# Patient Record
Sex: Male | Born: 1960
Health system: Southern US, Community
[De-identification: ages and names within clinical notes are randomized; demographics above are authoritative.]

## PROBLEM LIST (undated history)

## (undated) DIAGNOSIS — U071 COVID-19: Secondary | ICD-10-CM

## (undated) DIAGNOSIS — Z6830 Body mass index (BMI) 30.0-30.9, adult: Secondary | ICD-10-CM

## (undated) DIAGNOSIS — N200 Calculus of kidney: Secondary | ICD-10-CM

## (undated) HISTORY — DX: Body mass index (BMI) 30.0-30.9, adult: Z68.30

## (undated) HISTORY — DX: COVID-19: U07.1

---

## 1998-07-30 ENCOUNTER — Emergency Department (HOSPITAL_COMMUNITY): Admission: EM | Admit: 1998-07-30 | Discharge: 1998-07-30 | Payer: Self-pay | Admitting: Emergency Medicine

## 1999-09-03 HISTORY — PX: OTHER SURGICAL HISTORY: SHX169

## 2000-04-30 ENCOUNTER — Inpatient Hospital Stay (HOSPITAL_COMMUNITY): Admission: EM | Admit: 2000-04-30 | Discharge: 2000-05-02 | Payer: Self-pay | Admitting: Emergency Medicine

## 2000-04-30 ENCOUNTER — Encounter (INDEPENDENT_AMBULATORY_CARE_PROVIDER_SITE_OTHER): Payer: Self-pay | Admitting: Specialist

## 2002-08-24 ENCOUNTER — Emergency Department (HOSPITAL_COMMUNITY): Admission: EM | Admit: 2002-08-24 | Discharge: 2002-08-24 | Payer: Self-pay | Admitting: Emergency Medicine

## 2003-11-26 ENCOUNTER — Emergency Department (HOSPITAL_COMMUNITY): Admission: EM | Admit: 2003-11-26 | Discharge: 2003-11-26 | Payer: Self-pay | Admitting: Emergency Medicine

## 2003-12-08 ENCOUNTER — Emergency Department (HOSPITAL_COMMUNITY): Admission: EM | Admit: 2003-12-08 | Discharge: 2003-12-09 | Payer: Self-pay | Admitting: Emergency Medicine

## 2004-10-26 ENCOUNTER — Emergency Department (HOSPITAL_COMMUNITY): Admission: EM | Admit: 2004-10-26 | Discharge: 2004-10-26 | Payer: Self-pay | Admitting: Emergency Medicine

## 2005-12-14 ENCOUNTER — Emergency Department (HOSPITAL_COMMUNITY): Admission: EM | Admit: 2005-12-14 | Discharge: 2005-12-14 | Payer: Self-pay | Admitting: Emergency Medicine

## 2005-12-15 ENCOUNTER — Emergency Department (HOSPITAL_COMMUNITY): Admission: EM | Admit: 2005-12-15 | Discharge: 2005-12-15 | Payer: Self-pay | Admitting: Emergency Medicine

## 2006-11-21 ENCOUNTER — Emergency Department (HOSPITAL_COMMUNITY): Admission: EM | Admit: 2006-11-21 | Discharge: 2006-11-21 | Payer: Self-pay | Admitting: Emergency Medicine

## 2008-05-05 ENCOUNTER — Observation Stay (HOSPITAL_COMMUNITY): Admission: EM | Admit: 2008-05-05 | Discharge: 2008-05-06 | Payer: Self-pay | Admitting: Emergency Medicine

## 2008-05-05 ENCOUNTER — Ambulatory Visit: Payer: Self-pay | Admitting: Family Medicine

## 2008-05-23 ENCOUNTER — Encounter (INDEPENDENT_AMBULATORY_CARE_PROVIDER_SITE_OTHER): Payer: Self-pay | Admitting: *Deleted

## 2008-08-03 ENCOUNTER — Emergency Department (HOSPITAL_COMMUNITY): Admission: EM | Admit: 2008-08-03 | Discharge: 2008-08-03 | Payer: Self-pay | Admitting: Emergency Medicine

## 2009-03-12 ENCOUNTER — Emergency Department (HOSPITAL_COMMUNITY): Admission: EM | Admit: 2009-03-12 | Discharge: 2009-03-12 | Payer: Self-pay | Admitting: Emergency Medicine

## 2009-10-17 ENCOUNTER — Emergency Department (HOSPITAL_COMMUNITY): Admission: EM | Admit: 2009-10-17 | Discharge: 2009-10-18 | Payer: Self-pay | Admitting: Emergency Medicine

## 2009-11-03 ENCOUNTER — Emergency Department (HOSPITAL_COMMUNITY): Admission: EM | Admit: 2009-11-03 | Discharge: 2009-11-03 | Payer: Self-pay | Admitting: Internal Medicine

## 2009-11-29 ENCOUNTER — Emergency Department (HOSPITAL_COMMUNITY): Admission: EM | Admit: 2009-11-29 | Discharge: 2009-11-29 | Payer: Self-pay | Admitting: Emergency Medicine

## 2010-11-23 LAB — HEPATIC FUNCTION PANEL
ALT: 27 U/L (ref 0–53)
Alkaline Phosphatase: 87 U/L (ref 39–117)
Total Protein: 7.7 g/dL (ref 6.0–8.3)

## 2010-11-23 LAB — CBC
HCT: 41.3 % (ref 39.0–52.0)
Hemoglobin: 13.6 g/dL (ref 13.0–17.0)
MCV: 91.7 fL (ref 78.0–100.0)
RBC: 4.5 MIL/uL (ref 4.22–5.81)
WBC: 6.6 10*3/uL (ref 4.0–10.5)

## 2010-11-23 LAB — DIFFERENTIAL
Basophils Absolute: 0 10*3/uL (ref 0.0–0.1)
Basophils Relative: 0 % (ref 0–1)
Eosinophils Absolute: 0 10*3/uL (ref 0.0–0.7)
Eosinophils Relative: 1 % (ref 0–5)
Monocytes Absolute: 0.8 10*3/uL (ref 0.1–1.0)
Monocytes Relative: 13 % — ABNORMAL HIGH (ref 3–12)
Neutro Abs: 4.8 10*3/uL (ref 1.7–7.7)

## 2010-11-23 LAB — LIPASE, BLOOD: Lipase: 29 U/L (ref 11–59)

## 2010-11-23 LAB — BASIC METABOLIC PANEL
Calcium: 8.7 mg/dL (ref 8.4–10.5)
GFR calc Af Amer: >60 mL/min (ref >60)
GFR calc non Af Amer: >60 mL/min (ref >60)
Glucose, Bld: 96 mg/dL (ref 70–99)

## 2010-12-09 LAB — WOUND CULTURE: Gram Stain: NONE SEEN

## 2011-01-15 NOTE — Discharge Summary (Signed)
[name], [name]                 ACCOUNT NO.:  [id number]   MEDICAL RECORD NO.:  [id number]          PATIENT TYPE:  OBV   LOCATION:  4732                         FACILITY:  [facility]   PHYSICIAN:  Paula Compton, MD        DATE OF BIRTH:  06-26-1961   DATE OF ADMISSION:  05/05/2008  DATE OF DISCHARGE:  05/06/2008                               DISCHARGE SUMMARY   PRIMARY CARE Deetya Drouillard:  Gentry Fitz.   DISCHARGE DIAGNOSIS:  Noncardiac chest pain.   DISCHARGE MEDICATIONS:  1. Aspirin 81 mg 1 tab by mouth daily.  2. Toprol-XL 25 mg 1 tab by mouth twice a day.  3. Motrin 600 mg 1 tab by mouth twice a day as needed for pain.   PROCEDURES:  1. EKG on admission; Q-waves in anterior leads, right bundle-branch      block, sinus bradycardia.  2. Repeat EKG, unchanged.   LABS:  1. Cardiac enzymes negative x3.  2. Fasting lipid panel, total cholesterol 179, LDL 130, HDL 38,      triglycerides 57.   BRIEF HOSPITAL COURSE:  The patient is a 50 year old male who presented  to [facility] Emergency Department with once longstanding atypical chest  pain.  1. Atypical chest pain.  The patient complained two different types of      chest pain both seeming inconsistent with cardiac chest pain.      First chest pain has been going on for 15 years.  It is described      in the left chest wall, near axilla, associated with some left arm      numbness, comes and goes about every 4 months.  Other type of chest      pain.  It is substernal in location.  It is associated with      movement and in twisting certain directions.  Pain comes on all of      a sudden, lasts for 15-30 minutes, and then resolved on its own.      The patient was evaluated and ruled out for an MI.  EKG did have      some findings including Q-waves in anterior leads and a right      bundle-branch block.  A repeat EKG was unchanged.  On the day of      discharge, the patient was not complaining of any chest pain,      shortness of  breath, pleuritic chest pain.  The patient felt good      and the family practice service team felt that the patient was      stable and appropriate for outpatient followup.  The patient will      be followed up with [facility] and will be      scheduled an exercise stress test for further characterization of      his chest pain.  The patient was in complete understanding and      agreement.   DISCHARGE INSTRUCTIONS:  The patient has no restrictions on his diet.  No restrictions on activity.  The patient was  encouraged to get more  sleep and to avoid lifting heavy objects when he is in pain.   FOLLOWUP APPOINTMENTS:  The patient is to return to Dr. Lelon Perla at  [facility], phone number [phone] on May 24, 2008  at 1:30 p.m.  The patient is also to be scheduled an exercise stress  test as an outpatient.   DISCHARGE CONDITION:  The patient was discharged home in stable medical  condition.      Angelena Sole, MD  Electronically Signed      Paula Compton, MD  Electronically Signed    WS/MEDQ  D:  05/06/2008  T:  05/06/2008  Job:  [id number]

## 2011-01-18 NOTE — Op Note (Signed)
[place]. [facility]  Patient:    [name], [name]                          MRN: [id number] Proc. Date: 04/30/00 Adm. Date:  [id number] Attending:  Glenna Fellows Tappan                           Operative Report  PREOPERATIVE DIAGNOSIS:  Acute appendicitis.  POSTOPERATIVE DIAGNOSIS:  Acute appendicitis.  SURGICAL PROCEDURE:  Laparoscopy and open appendectomy.  SURGEON:  Lorne Skeens. Hoxworth, M.D.  ANESTHESIA:  General.  BRIEF HISTORY:  [name] is a 50 year old black male, who presents with an approximately 36-hour history of typical symptoms of appendicitis with initially periumbilical then steady and worsening right lower quadrant abdominal pain.  On examination, he has well-localized right lower quadrant abdominal pain with local peritoneal signs and white count is elevated at 14000.  Laparoscopic possible open appendectomy had been  recommended and accepted.  The nature of the procedure, its indications, and risks of bleeding and infection were discussed and understood.  DESCRIPTION OF PROCEDURE:  Patient brought to the operating room, placed in the supine position on the operating table and general endotracheal anesthesia was induced.  Foley catheter was placed.  The broad-spectrum antibiotics were given intravenously.  The abdomen was sterilely prepped and draped.  PS were in place.  A 1 cm incision was made at the umbilicus.  Dissection carried down to the midline fascia, which was sharply incised for 1 cm and the Hasson trocar inserted under direct vision.  A pneumoperitoneum was established.  The laparoscopy revealed a markedly inflamed appendix, which was adherent to the anterior abdominal wall.  Under direct vision, a 5 mm trocar was placed in the right upper quadrant and a 12 mm trocar in the left lower quadrant.  The inflammatory attachments were bluntly dissected off of the anterior abdominal wall.  The appendix was markedly thickened and  appeared to be chronically, as well as, acutely inflamed.  It was quite adherent to the terminal ileum, as well as, to the wall of the cecum.  After some initial dissection, I determined that I really could not safely free up the appendix laparoscopically and decided to convert the procedure to open.  The trocars were removed.  The purse-string suture was secured.  A right lower quadrant transverse incision was made and the external oblique divided in long lines of its fibers.  The internal oblique was bluntly split and the peritoneum, which was quite thickened.  This area was sharply entered.  The lateral edge of the rectus sheath was also opened.  A small amount of the rectus muscle divided extending the incision medially.  The inflammatory mass and cecum were brought out through the incision.  There were what appeared to be some chronic adhesions of the ______ of the terminal ileum to the inflamed appendix, which were divided between clamps and tied with 3-0 silk ties.  Some further adhesions of the appendix, which were markedly chronically and thickened, as well as, acutely inflamed were taken down along the cecum and the appendix was freed down to the mesoappendix, which was divided between clamps and tied with 3-0 silk ties.  The appendix was freed down to its base.  The appendix was quite thickened just beyond the base, but right at the junction of the cecum. The tissue felt normal.  This normal area was divided  with a firing of the TA-30 stapler and the appendix was removed.  The appendix did contain mucin. I opened the appendix and there was no obvious tumor mass, but it did contain mucin and was quite thickened.  I elected at this point to send this for permanent section and closed.  The right lower quadrant was thoroughly irrigated.  ______ was inspected for hemostasis and the viscera returned to their anatomic position.  The fascia was closed in layers with running 0 PDS. The  subcutaneous tissues were irrigated.  The incisions were closed with staples.  Sponge and needle counts were correct.  Dry, sterile dressings were applied and the patient taken to recovery in good condition. DD:  04/30/00 TD:  05/01/00 Job: [id number]

## 2011-06-05 LAB — TSH: TSH: 1.178

## 2011-06-05 LAB — LIPID PANEL
LDL Cholesterol: 130 — ABNORMAL HIGH
Triglycerides: 57

## 2011-06-05 LAB — CBC
HCT: 37.6 — ABNORMAL LOW
Hemoglobin: 12.5 — ABNORMAL LOW
MCHC: 33.3
Platelets: 238
RBC: 4.13 — ABNORMAL LOW
RDW: 12.3
WBC: 7.6

## 2011-06-05 LAB — POCT I-STAT, CHEM 8
Calcium, Ion: 1.15
Chloride: 107
Potassium: 3.9
Sodium: 138
TCO2: 25

## 2011-06-05 LAB — POCT CARDIAC MARKERS
CKMB, poc: 1.3
CKMB, poc: 1.4
Myoglobin, poc: 66.7
Troponin i, poc: <0.05

## 2011-06-05 LAB — BASIC METABOLIC PANEL
BUN: 15
Creatinine, Ser: 0.94
GFR calc Af Amer: >60
GFR calc non Af Amer: >60
Glucose, Bld: 94

## 2011-06-05 LAB — CK TOTAL AND CKMB (NOT AT ARMC): CK, MB: 1.7

## 2011-06-05 LAB — CARDIAC PANEL(CRET KIN+CKTOT+MB+TROPI): CK, MB: 1.4

## 2011-06-05 LAB — TROPONIN I: Troponin I: 0.01

## 2013-09-12 ENCOUNTER — Emergency Department (HOSPITAL_BASED_OUTPATIENT_CLINIC_OR_DEPARTMENT_OTHER)
Admission: EM | Admit: 2013-09-12 | Discharge: 2013-09-12 | Disposition: A | Discharge status: Home / Self Care | Payer: Self-pay | Attending: Emergency Medicine | Admitting: Emergency Medicine

## 2013-09-12 ENCOUNTER — Encounter (HOSPITAL_BASED_OUTPATIENT_CLINIC_OR_DEPARTMENT_OTHER): Payer: Self-pay | Admitting: Emergency Medicine

## 2013-09-12 DIAGNOSIS — K047 Periapical abscess without sinus: Secondary | ICD-10-CM | POA: Insufficient documentation

## 2013-09-12 DIAGNOSIS — K029 Dental caries, unspecified: Secondary | ICD-10-CM | POA: Insufficient documentation

## 2013-09-12 MED ORDER — IBUPROFEN 600 MG PO TABS: 600.0000 mg | ORAL_TABLET | Freq: Four times a day (QID) | ORAL | Status: DC | PRN

## 2013-09-12 MED ORDER — OXYCODONE-ACETAMINOPHEN 5-325 MG PO TABS: 1.0000 | ORAL_TABLET | ORAL | Status: DC | PRN

## 2013-09-12 MED ORDER — AMOXICILLIN 500 MG PO CAPS
500.0000 mg | ORAL_CAPSULE | Freq: Three times a day (TID) | ORAL | Status: DC
Start: 2013-09-12 — End: 2016-10-29

## 2013-09-12 NOTE — ED Notes (Signed)
Patient here with right lower dental pain x 2 days, reports tooth broken and just started swelling and causing pain

## 2013-09-12 NOTE — Discharge Instructions (Signed)
It is important that you get care with a dentist as soon as possible. Return here as needed.  Abscessed Tooth An abscessed tooth is an infection around your tooth. It may be caused by holes or damage to the tooth (cavity) or a dental disease. An abscessed tooth causes mild to very bad pain in and around the tooth. See your dentist right away if you have tooth or gum pain. HOME CARE  Take your medicine as told. Finish it even if you start to feel better.  Do not drive after taking pain medicine.  Rinse your mouth (gargle) often with salt water ( teaspoon salt in 8 ounces of warm water).  Do not apply heat to the outside of your face. GET HELP RIGHT AWAY IF:   You have a temperature by mouth above 102 F (38.9 C), not controlled by medicine.  You have chills and a very bad headache.  You have problems breathing or swallowing.  Your mouth will not open.  You develop puffiness (swelling) on the neck or around the eye.  Your pain is not helped by medicine.  Your pain is getting worse instead of better. MAKE SURE YOU:   Understand these instructions.  Will watch your condition.  Will get help right away if you are not doing well or get worse. Document Released: 02/05/2008 Document Revised: 11/11/2011 Document Reviewed: 11/27/2010 [facility] Patient Information 2014 [employer], [place].

## 2013-09-12 NOTE — ED Provider Notes (Signed)
CSN: [id number]     Arrival date & time 09/12/13  1232 History   First MD Initiated Contact with Patient 09/12/13 1255     Chief Complaint  Patient presents with  . Dental Pain   (Consider location/radiation/quality/duration/timing/severity/associated sxs/prior Treatment) Patient is a 53 y.o. male presenting with tooth pain. The history is provided by the patient.  Dental Pain Location:  Lower Lower teeth location:  30/RL 1st molar, 29/RL 2nd bicuspid and 31/RL 2nd molar Quality:  Throbbing Severity:  Severe Onset quality:  Gradual Duration:  2 days Timing:  Constant Progression:  Worsening Chronicity:  New Context: abscess and poor dentition   Relieved by:  Nothing Worsened by:  Cold food/drink, touching, pressure and jaw movement Ineffective treatments:  None tried Associated symptoms: facial swelling   Associated symptoms: no fever and no oral lesions     History reviewed. No pertinent past medical history. History reviewed. No pertinent past surgical history. No family history on file. History  Substance Use Topics  . Smoking status: Never Smoker   . Smokeless tobacco: Not on file  . Alcohol Use: Not on file    Review of Systems  Constitutional: Negative for fever and chills.  HENT: Positive for dental problem and facial swelling. Negative for ear pain, mouth sores and rhinorrhea.   Eyes: Negative for pain and redness.  Respiratory: Negative for cough and shortness of breath.   Cardiovascular: Negative for chest pain.  Gastrointestinal: Negative for nausea, vomiting and abdominal pain.  Musculoskeletal: Negative for myalgias.  Skin: Negative for rash.  Neurological: Negative for dizziness and light-headedness.  Psychiatric/Behavioral: Negative for confusion. The patient is not nervous/anxious.     Allergies  Review of patient's allergies indicates no known allergies.  Home Medications  No current outpatient prescriptions on file. BP 140/104  Pulse 82   Temp(Src) 99.1 F (37.3 C) (Oral)  Resp 18  SpO2 98% Physical Exam  Nursing note and vitals reviewed. Constitutional: He is oriented to person, place, and time. He appears well-developed and well-nourished.  HENT:  Head:    Mouth/Throat: Uvula is midline, oropharynx is clear and moist and mucous membranes are normal. Dental abscesses and dental caries present.  Facial swelling, decayed teeth into the gum area lower right with swelling of the gum surrounding the teeth.  Tender on exam.   Eyes: Conjunctivae and EOM are normal.  Neck: Neck supple.  Cardiovascular: Normal rate.   Pulmonary/Chest: Effort normal.  Musculoskeletal: Normal range of motion.  Neurological: He is alert and oriented to person, place, and time. No cranial nerve deficit.  Skin: Skin is warm and dry.  Psychiatric: He has a normal mood and affect. His behavior is normal.    ED Course  Procedures  MDM  53 y.o. male with multiple dental caries, abscess lower right dental area at 1st molar. Will treat with antibiotics and pain medication. Discussed with patient need for follow up with dentist as soon as possible. Patient voices understanding. Stable for discharge with out signs of systemic infection at this time.    Medication List         amoxicillin 500 MG capsule  Commonly known as:  AMOXIL  Take 1 capsule (500 mg total) by mouth 3 (three) times daily.     ibuprofen 600 MG tablet  Commonly known as:  ADVIL,MOTRIN  Take 1 tablet (600 mg total) by mouth every 6 (six) hours as needed.     oxyCODONE-acetaminophen 5-325 MG per tablet  Commonly known as:  ROXICET  Take 1 tablet by mouth every 4 (four) hours as needed for severe pain.            Janne NapoleonHope M Neese, NP 09/12/13 1322

## 2013-09-12 NOTE — ED Provider Notes (Signed)
Medical screening examination/treatment/procedure(s) were performed by non-physician practitioner and as supervising physician I was immediately available for consultation/collaboration.    Bernette Seeman E Cataleyah Colborn, MD 09/12/13 1542 

## 2015-09-05 ENCOUNTER — Encounter (HOSPITAL_BASED_OUTPATIENT_CLINIC_OR_DEPARTMENT_OTHER): Payer: Self-pay | Admitting: *Deleted

## 2015-09-05 ENCOUNTER — Emergency Department (HOSPITAL_BASED_OUTPATIENT_CLINIC_OR_DEPARTMENT_OTHER)
Admission: EM | Admit: 2015-09-05 | Discharge: 2015-09-05 | Disposition: A | Discharge status: Home / Self Care | Payer: Self-pay | Attending: Emergency Medicine | Admitting: Emergency Medicine

## 2015-09-05 ENCOUNTER — Emergency Department (HOSPITAL_BASED_OUTPATIENT_CLINIC_OR_DEPARTMENT_OTHER): Payer: Self-pay

## 2015-09-05 DIAGNOSIS — M79671 Pain in right foot: Secondary | ICD-10-CM | POA: Insufficient documentation

## 2015-09-05 DIAGNOSIS — Z792 Long term (current) use of antibiotics: Secondary | ICD-10-CM | POA: Insufficient documentation

## 2015-09-05 DIAGNOSIS — Z87442 Personal history of urinary calculi: Secondary | ICD-10-CM | POA: Insufficient documentation

## 2015-09-05 HISTORY — DX: Calculus of kidney: N20.0

## 2015-09-05 MED ORDER — NAPROXEN 500 MG PO TABS: 500.0000 mg | ORAL_TABLET | Freq: Two times a day (BID) | ORAL | Status: DC

## 2015-09-05 MED FILL — NAPROXEN 500 MG TABLET: 500 | 15 days supply | Qty: 30 | Fill #0

## 2015-09-05 NOTE — ED Provider Notes (Signed)
CSN: [id number]     Arrival date & time 09/05/15  1137 History   First MD Initiated Contact with Patient 09/05/15 1411     Chief Complaint  Patient presents with  . Foot Pain    right     (Consider location/radiation/quality/duration/timing/severity/associated sxs/prior Treatment) Patient is a 55 y.o. male presenting with lower extremity pain. The history is provided by the patient and medical records.  Foot Pain Associated symptoms include arthralgias.    55 y.o. M with hx of kidney stones, presenting to the ED for right heel pain.  Patient states this is been going on for the past 6 months or so. States pain is worse in the morning when he first wakes up. He states the first steps to the bathroom or very painful, however it eases off somewhat as the day goes on. He states when he is walking during motion, pain is better, however pain is worse when standing in one spot or after sitting down for 30+ minutes.  Pain is sharp, non-radiating.  No numbness/weakness of feet.  No open wounds or sores.  No hx of DM or neuropathy.  He has tried soaking his feet in epsom salt and alcohol without much relief.  VSS.  Past Medical History  Diagnosis Date  . Kidney stone    Past Surgical History  Procedure Laterality Date  . Kidney stone removal Left 2001   No family history on file. Social History  Substance Use Topics  . Smoking status: Never Smoker   . Smokeless tobacco: Never Used  . Alcohol Use: Yes     Comment: occassional    Review of Systems  Musculoskeletal: Positive for arthralgias.  All other systems reviewed and are negative.     Allergies  Review of patient's allergies indicates no known allergies.  Home Medications   Prior to Admission medications   Medication Sig Start Date End Date Taking? Authorizing Provider  amoxicillin (AMOXIL) 500 MG capsule Take 1 capsule (500 mg total) by mouth 3 (three) times daily. 09/12/13   Hope Orlene Och, NP  ibuprofen (ADVIL,MOTRIN) 600 MG  tablet Take 1 tablet (600 mg total) by mouth every 6 (six) hours as needed. 09/12/13   Hope Orlene Och, NP  oxyCODONE-acetaminophen (ROXICET) 5-325 MG per tablet Take 1 tablet by mouth every 4 (four) hours as needed for severe pain. 09/12/13   Hope Orlene Och, NP   BP 143/94 mmHg  Pulse 88  Temp(Src) 98.1 F (36.7 C) (Oral)  Resp 16  Ht 6\' 3"  (1.905 m)  Wt 97.523 kg  BMI 26.87 kg/m2  SpO2 100%   Physical Exam  Constitutional: He is oriented to person, place, and time. He appears well-developed and well-nourished. No distress.  HENT:  Head: Normocephalic and atraumatic.  Mouth/Throat: Oropharynx is clear and moist.  Eyes: Conjunctivae and EOM are normal. Pupils are equal, round, and reactive to light.  Neck: Normal range of motion. Neck supple.  Cardiovascular: Normal rate, regular rhythm and normal heart sounds.   Pulmonary/Chest: Effort normal and breath sounds normal. No respiratory distress. He has no wheezes.  Abdominal: Soft.  Musculoskeletal: Normal range of motion. He exhibits no edema.  TTP of right heel along sole of foot; no acute deformities or swelling noted; no open wounds or sores; achilles tendon intact; full ROM of ankle, foot, and all toes; DP pulse intact; foot is warm and well perfused  Neurological: He is alert and oriented to person, place, and time.  Skin: Skin is warm  and dry. He is not diaphoretic.  Psychiatric: He has a normal mood and affect.  Nursing note and vitals reviewed.   ED Course  Procedures (including critical care time) Labs Review Labs Reviewed - No data to display  Imaging Review Dg Foot Complete Right  09/05/2015  CLINICAL DATA:  Right heel pain for 4 months, no known injury EXAM: RIGHT FOOT COMPLETE - 3+ VIEW COMPARISON:  None. FINDINGS: Three views of the right foot submitted. No acute fracture or subluxation. Small plantar spur of calcaneus. IMPRESSION: No acute fracture or subluxation.  Small plantar spur of calcaneus Electronically Signed    By: Natasha MeadLiviu  Pop M.D.   On: 09/05/2015 12:37   I have personally reviewed and evaluated these images and lab results as part of my medical decision-making.   EKG Interpretation None      MDM   Final diagnoses:  Right foot pain   55 year old male here with right heel pain for the past several months. No known injury, trauma, or falls. Patient does stand on his feet for long hours during the day. There are no acute deformities noted on exam. He does have some tenderness along right heel and sole foot. He has no open wounds or sores. Foot is neurovascularly and time. X-ray was obtained which does reveal a plantar spur of the calcaneus-- this is potentially the source of his pain.  I have recommended comfort inserts for his shoes, will start naprosyn.  Follow-up with podiatry given if needed.  Discussed plan with patient, he/she acknowledged understanding and agreed with plan of care.  Return precautions given for new or worsening symptoms.  Garlon HatchetLisa M Kristoff Coonradt, PA-C 09/05/15 1604  Alvira MondayErin Schlossman, MD 09/06/15 1218

## 2015-09-05 NOTE — Discharge Instructions (Signed)
Take the prescribed medication as directed. Also recommend to try shoe inserts (such as Dr. Margart SicklesScholl's)-- available at [facility], [facility], etc. Follow-up with podiatry if needed. Return to the ED for new or worsening symptoms.  Heel Spur A heel spur is a bony growth that forms on the bottom of your heel bone (calcaneus). Heel spurs are common and do not always cause pain. However, heel spurs often cause inflammation in the strong band of tissue that runs underneath the bone of your foot (plantar fascia). When this happens, you may feel pain on the bottom of your foot, near your heel.  CAUSES  The cause of heel spurs is not completely understood. They may be caused by pressure on the heel. Or, they may stem from the muscle attachments (tendons) near the spur pulling on the heel.  RISK FACTORS You may be at risk for a heel spur if you:  Are older than 40.  Are overweight.  Have wear and tear arthritis (osteoarthritis).  Have plantar fascia inflammation. SIGNS AND SYMPTOMS  Some people have heel spurs but no symptoms. If you do have symptoms, they may include:   Pain in the bottom of your heel.  Pain that is worse when you first get out of bed.  Pain that gets worse after walking or standing. DIAGNOSIS  Your health care provider may diagnose a heel spur based on your symptoms and a physical exam. You may also have an X-ray of your foot to check for a bony growth coming from the calcaneus.  TREATMENT Treatment aims to relieve the pain from the heel spur. This may include:  Stretching exercises.  Losing weight.  Wearing specific shoes, inserts, or orthotics for comfort and support.  Wearing splints at night to properly position your feet.  Taking over-the-counter medicine to relieve pain.  Being treated with high-intensity sound waves to break up the heel spur (extracorporeal shock wave therapy).  Getting steroid injections in your heel to reduce swelling and ease pain.  Having  surgery if your heel spur causes long-term (chronic) pain. HOME CARE INSTRUCTIONS   Take medicines only as directed by your health care provider.  Ask your health care provider if you should use ice or cold packs on the painful areas of your heel or foot.  Avoid activities that cause you pain until you recover or as directed by your health care provider.  Stretch before exercising or being physically active.  Wear supportive shoes that fit well as directed by your health care provider. You might need to buy new shoes. Wearing old shoes or shoes that do not fit correctly may not provide the support that you need.  Lose weight if your health care provider thinks you should. This can relieve pressure on your foot that may be causing pain and discomfort. SEEK MEDICAL CARE IF:   Your pain continues or gets worse.   This information is not intended to replace advice given to you by your health care provider. Make sure you discuss any questions you have with your health care provider.   Document Released: 09/25/2005 Document Revised: 09/09/2014 Document Reviewed: 10/20/2013 Elsevier Interactive Patient Education [employer].

## 2015-09-05 NOTE — ED Notes (Signed)
Patient states he has a four or five month history of right heel pain.  Works two jobs and stands on his feet a lot.

## 2016-10-29 ENCOUNTER — Emergency Department (HOSPITAL_BASED_OUTPATIENT_CLINIC_OR_DEPARTMENT_OTHER)
Admission: EM | Admit: 2016-10-29 | Discharge: 2016-10-29 | Disposition: A | Discharge status: Home / Self Care | Payer: Self-pay | Attending: Emergency Medicine | Admitting: Emergency Medicine

## 2016-10-29 ENCOUNTER — Encounter (HOSPITAL_BASED_OUTPATIENT_CLINIC_OR_DEPARTMENT_OTHER): Payer: Self-pay | Admitting: *Deleted

## 2016-10-29 ENCOUNTER — Emergency Department (HOSPITAL_BASED_OUTPATIENT_CLINIC_OR_DEPARTMENT_OTHER): Payer: Self-pay

## 2016-10-29 DIAGNOSIS — G8929 Other chronic pain: Secondary | ICD-10-CM | POA: Insufficient documentation

## 2016-10-29 DIAGNOSIS — M25562 Pain in left knee: Secondary | ICD-10-CM | POA: Insufficient documentation

## 2016-10-29 NOTE — ED Triage Notes (Addendum)
Pt reports ongoing stability issues with left knee for years. States he is now having difficulty walking down stairs because his knee does not feel stable

## 2016-10-29 NOTE — Discharge Instructions (Signed)
Please try Ibuprofen (Advil) or Naproxen (Aleve) for pain as needed Elevate and ice the knee for 20 minutes if knee gets swollen Follow up with Orthopedics

## 2016-10-29 NOTE — ED Provider Notes (Signed)
[facility]   CSN: [id number] Arrival date & time: 10/29/16  7829     History   Chief Complaint Chief Complaint  Patient presents with  . Knee Pain    HPI [name] is a 56 y.o. male who presents with left knee pain. He states the knee has bothered him for 9-10 years. He presented today because now he has insurance and wanted to get it checked. He states his pain is mostly posterior and on the sides. It is worse with going up stairs and getting out of the car. Denies pain with walking or rest pain. The triage note stated that he felt like the knee was not stable however the patient states his issue is mostly pain. Denies acute or old injury. States he played sports when he was younger but denies any major injury from that. No fever, weakness, numbness or tingling. He has been putting alcohol on it with no relief. Has not tried any medicines.  HPI  Past Medical History:  Diagnosis Date  . Kidney stone     There are no active problems to display for this patient.   Past Surgical History:  Procedure Laterality Date  . kidney stone removal Left 2001       Home Medications    Prior to Admission medications   Medication Sig Start Date End Date Taking? Authorizing Provider  amoxicillin (AMOXIL) 500 MG capsule Take 1 capsule (500 mg total) by mouth 3 (three) times daily. 09/12/13   Hope Orlene Och, NP  ibuprofen (ADVIL,MOTRIN) 600 MG tablet Take 1 tablet (600 mg total) by mouth every 6 (six) hours as needed. 09/12/13   Hope Orlene Och, NP  naproxen (NAPROSYN) 500 MG tablet Take 1 tablet (500 mg total) by mouth 2 (two) times daily with a meal. 09/05/15   Garlon Hatchet, PA-C  oxyCODONE-acetaminophen (ROXICET) 5-325 MG per tablet Take 1 tablet by mouth every 4 (four) hours as needed for severe pain. 09/12/13   Hope Orlene Och, NP    Family History No family history on file.  Social History Social History  Substance Use Topics  . Smoking status: Never Smoker    . Smokeless tobacco: Never Used  . Alcohol use Yes     Comment: occassional     Allergies   Patient has no known allergies.   Review of Systems Review of Systems  Constitutional: Negative for fever.  Musculoskeletal: Positive for arthralgias and joint swelling (intermittent). Negative for gait problem.  Skin: Negative for color change and wound.  Neurological: Negative for numbness.     Physical Exam Updated Vital Signs BP 131/97 (BP Location: Left Arm)   Pulse 64   Temp 98 F (36.7 C) (Oral)   Resp 16   Ht 6\' 1"  (1.854 m)   Wt 97.5 kg   SpO2 96%   BMI 28.37 kg/m   Physical Exam  Constitutional: He is oriented to person, place, and time. He appears well-developed and well-nourished. No distress.  HENT:  Head: Normocephalic and atraumatic.  Eyes: Conjunctivae are normal. Pupils are equal, round, and reactive to light. Right eye exhibits no discharge. Left eye exhibits no discharge. No scleral icterus.  Neck: Normal range of motion.  Cardiovascular: Normal rate.   Pulmonary/Chest: Effort normal. No respiratory distress.  Abdominal: He exhibits no distension.  Musculoskeletal:  Left knee: No obvious swelling or deformity. No tenderness to palpation. FROM. N/V intact. Negative anterior/posterior drawer, valgus/varus stress test. Ambulatory with normal gait.  Neurological: He is alert and oriented to person, place, and time.  Skin: Skin is warm and dry.  Psychiatric: He has a normal mood and affect. His behavior is normal.  Nursing note and vitals reviewed.    ED Treatments / Results  Labs (all labs ordered are listed, but only abnormal results are displayed) Labs Reviewed - No data to display  EKG  EKG Interpretation None       Radiology Dg Knee Complete 4 Views Left  Result Date: 10/29/2016 CLINICAL DATA:  Pain and instability, no known injury, initial encounter EXAM: LEFT KNEE - COMPLETE 4+ VIEW COMPARISON:  None. FINDINGS: No evidence of fracture,  dislocation, or joint effusion. No evidence of arthropathy or other focal bone abnormality. Soft tissues are unremarkable. IMPRESSION: No acute abnormality noted. Electronically Signed   By: Alcide CleverMark  Lukens M.D.   On: 10/29/2016 08:48    Procedures Procedures (including critical care time)  Medications Ordered in ED Medications - No data to display   Initial Impression / Assessment and Plan / ED Course  I have reviewed the triage vital signs and the nursing notes.  Pertinent labs & imaging results that were available during my care of the patient were reviewed by me and considered in my medical decision making (see chart for details).  56 year old male with left knee pain for years. No tenderness or significant ligament laxity on exam. X-ray does not show significant arthritis. We'll recommend NSAIDs and ice as needed and give him follow-up with orthopedics for possible outpatient MRI.   Final Clinical Impressions(s) / ED Diagnoses   Final diagnoses:  Chronic pain of left knee    New Prescriptions New Prescriptions   No medications on file     Bethel BornKelly Marie Sandrine Bloodsworth, PA-C 10/29/16 1210    Pricilla LovelessScott Goldston, MD 10/31/16 1827

## 2016-10-29 NOTE — ED Notes (Signed)
Ambulatory with steady gait. Advised to limit activities where his knee feels unstable

## 2016-12-03 ENCOUNTER — Ambulatory Visit (INDEPENDENT_AMBULATORY_CARE_PROVIDER_SITE_OTHER): Payer: BLUE CROSS/BLUE SHIELD | Admitting: Family Medicine

## 2016-12-03 ENCOUNTER — Encounter: Payer: Self-pay | Admitting: Family Medicine

## 2016-12-03 DIAGNOSIS — M25562 Pain in left knee: Secondary | ICD-10-CM

## 2016-12-03 DIAGNOSIS — G8929 Other chronic pain: Secondary | ICD-10-CM | POA: Diagnosis not present

## 2016-12-03 MED ORDER — MELOXICAM 15 MG PO TABS: 15.0000 mg | ORAL_TABLET | Freq: Every day | ORAL | 2 refills | Status: DC

## 2016-12-03 MED FILL — MELOXICAM 15 MG TABLET: 15 | 30 days supply | Qty: 30 | Fill #0

## 2016-12-03 NOTE — Patient Instructions (Signed)
Your pain is due to mild arthritis or a degenerative lateral meniscus tear. Both are treated similarly initially. These are the different medications you can take for this: Tylenol  1-2 tabs three times a day for pain. Meloxicam  daily with food for pain and inflammation. Capsaicin, aspercreme, or biofreeze topically up to four times a day may also help with pain. Some supplements that may help for arthritis: Boswellia extract, curcumin, pycnogenol Cortisone injections are an option. If cortisone injections do not help, there are different types of shots that may help but they take longer to take effect. It's important that you continue to stay active. Straight leg raises, knee extensions 3 sets of 10 once a day (add ankle weight if these become too easy). Consider physical therapy to strengthen muscles around the joint that hurts to take pressure off of the joint itself. Shoe inserts with good arch support may be helpful. Heat or ice 15 minutes at a time 3-4 times a day as needed to help with pain. Water aerobics and cycling with low resistance are the best two types of exercise for arthritis. Follow up with me in 1 month or as needed.

## 2016-12-05 DIAGNOSIS — M25562 Pain in left knee: Secondary | ICD-10-CM | POA: Insufficient documentation

## 2016-12-05 NOTE — Assessment & Plan Note (Signed)
Independently reviewed radiographs - no evidence arthritis but these were not weight bearing films.  Consistent with either mild arthritis lateral compartment vs degenerative lateral meniscus tear.  Discussed tylenol, will start meloxicam.  Topical medications, discussed some supplements and home exercises as well.  Consider physical therapy, injection.  f/u in 1 month or prn.

## 2016-12-05 NOTE — Progress Notes (Signed)
PCP: No PCP Per Patient  Subjective:   HPI: Patient is a 56 y.o. male here for left knee pain.  Patient reports he's had left knee pain since at least 1999. No acute injuries but he played sports when he was younger. Pain is 3/10, more lateral. Worse with bending knee and going from sitting to standing. No swelling. No skin changes, numbness.  Past Medical History:  Diagnosis Date  . Kidney stone     No current outpatient prescriptions on file prior to visit.   No current facility-administered medications on file prior to visit.     Past Surgical History:  Procedure Laterality Date  . kidney stone removal Left 2001    No Known Allergies  Social History   Social History  . Marital status: Married    Spouse name: N/A  . Number of children: N/A  . Years of education: N/A   Occupational History  . Not on file.   Social History Main Topics  . Smoking status: Never Smoker  . Smokeless tobacco: Never Used  . Alcohol use Yes     Comment: occassional  . Drug use: No  . Sexual activity: Not on file   Other Topics Concern  . Not on file   Social History Narrative  . No narrative on file    No family history on file.  BP (!) 130/95   Pulse 76   Ht 6' (1.829 m)   Wt 220 lb (99.8 kg)   BMI 29.84 kg/m   Review of Systems: See HPI above.     Objective:  Physical Exam:  Gen: NAD, comfortable in exam room  Left knee: No gross deformity, ecchymoses, effusion. TTP lateral joint line.  No other tenderness. FROM. Negative ant/post drawers. Negative valgus/varus testing. Negative lachmanns. Negative mcmurrays, apleys, patellar apprehension. NV intact distally.  Right knee: FROM without pain.   Assessment & Plan:  1. Left knee pain - Independently reviewed radiographs - no evidence arthritis but these were not weight bearing films.  Consistent with either mild arthritis lateral compartment vs degenerative lateral meniscus tear.  Discussed tylenol, will  start meloxicam.  Topical medications, discussed some supplements and home exercises as well.  Consider physical therapy, injection.  f/u in 1 month or prn.

## 2017-01-07 ENCOUNTER — Ambulatory Visit: Payer: BLUE CROSS/BLUE SHIELD | Admitting: Family Medicine

## 2017-01-09 ENCOUNTER — Ambulatory Visit: Payer: BLUE CROSS/BLUE SHIELD | Admitting: Family Medicine

## 2017-01-14 ENCOUNTER — Ambulatory Visit (INDEPENDENT_AMBULATORY_CARE_PROVIDER_SITE_OTHER): Payer: BLUE CROSS/BLUE SHIELD | Admitting: Family Medicine

## 2017-01-14 ENCOUNTER — Encounter: Payer: Self-pay | Admitting: Family Medicine

## 2017-01-14 ENCOUNTER — Ambulatory Visit: Payer: BLUE CROSS/BLUE SHIELD | Admitting: Family Medicine

## 2017-01-14 DIAGNOSIS — G8929 Other chronic pain: Secondary | ICD-10-CM

## 2017-01-14 DIAGNOSIS — M25562 Pain in left knee: Secondary | ICD-10-CM | POA: Diagnosis not present

## 2017-01-14 NOTE — Patient Instructions (Signed)
Your pain is due to mild arthritis or a degenerative lateral meniscus tear. You're doing well. Take the meloxicam only if needed now. Capsaicin, aspercreme, or biofreeze topically up to four times a day may also help with pain. Some supplements that may help for arthritis: Boswellia extract, curcumin, pycnogenol Cortisone injections are an option if pain is severe. Physical therapy is a consideration if your pain is mild to moderate and you want to go this route. Straight leg raises, knee extensions 3 sets of 10 once a day (add ankle weight if these become too easy). Heat or ice 15 minutes at a time 3-4 times a day as needed to help with pain. Follow up with me as needed otherwise.

## 2017-01-15 NOTE — Progress Notes (Signed)
PCP: Patient, No Pcp Per  Subjective:   HPI: Patient is a 56 y.o. male here for left knee pain.  4/3: Patient reports he's had left knee pain since at least 1999. No acute injuries but he played sports when he was younger. Pain is 3/10, more lateral. Worse with bending knee and going from sitting to standing. No swelling. No skin changes, numbness.  5/15: Patient reports feeling about 40% improved from last visit. Taking meloxicam only if needed. Has not been doing home exercises. Has been working a lot though. Pain level 0/10 currently. No skin changes, numbness.  Past Medical History:  Diagnosis Date  . Kidney stone     Current Outpatient Prescriptions on File Prior to Visit  Medication Sig Dispense Refill  . meloxicam (MOBIC) 15 MG tablet Take 1 tablet (15 mg total) by mouth daily. 30 tablet 2   No current facility-administered medications on file prior to visit.     Past Surgical History:  Procedure Laterality Date  . kidney stone removal Left 2001    No Known Allergies  Social History   Social History  . Marital status: Married    Spouse name: N/A  . Number of children: N/A  . Years of education: N/A   Occupational History  . Not on file.   Social History Main Topics  . Smoking status: Never Smoker  . Smokeless tobacco: Never Used  . Alcohol use Yes     Comment: occassional  . Drug use: No  . Sexual activity: Not on file   Other Topics Concern  . Not on file   Social History Narrative  . No narrative on file    No family history on file.  BP 123/84   Pulse 93   Ht 6' (1.829 m)   Wt 220 lb (99.8 kg)   BMI 29.84 kg/m   Review of Systems: See HPI above.     Objective:  Physical Exam:  Gen: NAD, comfortable in exam room  Left knee: No gross deformity, ecchymoses, effusion. Mild TTP lateral joint line.  No other tenderness. FROM. Negative ant/post drawers. Negative valgus/varus testing. Negative lachmanns. Negative mcmurrays,  apleys, patellar apprehension. NV intact distally.  Right knee: FROM without pain.   Assessment & Plan:  1. Left knee pain - Radiographs negative for arthritis but not weight bearing.  Consistent with either mild arthritis lateral compartment vs degenerative lateral meniscus tear.  Improved some.  Tylenol and meloxicam only if needed.  Encouraged home exercises.  Topical medications, discussed some supplementsConsider physical therapy, injection.  F/u prn.

## 2017-01-15 NOTE — Assessment & Plan Note (Signed)
Radiographs negative for arthritis but not weight bearing.  Consistent with either mild arthritis lateral compartment vs degenerative lateral meniscus tear.  Improved some.  Tylenol and meloxicam only if needed.  Encouraged home exercises.  Topical medications, discussed some supplementsConsider physical therapy, injection.  F/u prn.

## 2017-03-11 MED FILL — MELOXICAM 15 MG TABLET: 15 | 30 days supply | Qty: 30 | Fill #1

## 2017-10-15 ENCOUNTER — Emergency Department (HOSPITAL_BASED_OUTPATIENT_CLINIC_OR_DEPARTMENT_OTHER): Payer: Worker's Compensation

## 2017-10-15 ENCOUNTER — Encounter (HOSPITAL_BASED_OUTPATIENT_CLINIC_OR_DEPARTMENT_OTHER): Payer: Self-pay

## 2017-10-15 ENCOUNTER — Emergency Department (HOSPITAL_BASED_OUTPATIENT_CLINIC_OR_DEPARTMENT_OTHER)
Admission: EM | Admit: 2017-10-15 | Discharge: 2017-10-15 | Disposition: A | Discharge status: Home / Self Care | Payer: Worker's Compensation | Attending: Emergency Medicine | Admitting: Emergency Medicine

## 2017-10-15 ENCOUNTER — Other Ambulatory Visit: Payer: Self-pay

## 2017-10-15 DIAGNOSIS — S67192A Crushing injury of right middle finger, initial encounter: Secondary | ICD-10-CM | POA: Diagnosis not present

## 2017-10-15 DIAGNOSIS — S6010XA Contusion of unspecified finger with damage to nail, initial encounter: Secondary | ICD-10-CM

## 2017-10-15 DIAGNOSIS — W230XXA Caught, crushed, jammed, or pinched between moving objects, initial encounter: Secondary | ICD-10-CM | POA: Insufficient documentation

## 2017-10-15 DIAGNOSIS — Y929 Unspecified place or not applicable: Secondary | ICD-10-CM | POA: Diagnosis not present

## 2017-10-15 DIAGNOSIS — S60131A Contusion of right middle finger with damage to nail, initial encounter: Secondary | ICD-10-CM | POA: Insufficient documentation

## 2017-10-15 DIAGNOSIS — Y9389 Activity, other specified: Secondary | ICD-10-CM | POA: Insufficient documentation

## 2017-10-15 DIAGNOSIS — S6710XA Crushing injury of unspecified finger(s), initial encounter: Secondary | ICD-10-CM

## 2017-10-15 DIAGNOSIS — Y990 Civilian activity done for income or pay: Secondary | ICD-10-CM | POA: Diagnosis not present

## 2017-10-15 DIAGNOSIS — S6991XA Unspecified injury of right wrist, hand and finger(s), initial encounter: Secondary | ICD-10-CM | POA: Diagnosis present

## 2017-10-15 NOTE — ED Triage Notes (Addendum)
Pt injured right middle finger at work 2 days ago-was slammed in dish washer-tip of finger/nail is dark-NAD-steady gait

## 2017-10-15 NOTE — ED Notes (Signed)
Mashed rt middle finger 2 days ago  Increased swelling and blood under nail

## 2017-10-15 NOTE — ED Provider Notes (Signed)
[facility] Provider Note: Lowella DellJ. Lane Jeff Frieden, MD, FACEP  CSN: [id number] MRN: [id number] ARRIVAL: 10/15/17 at 2205 ROOM: [facility]   CHIEF COMPLAINT  Finger Injury   HISTORY OF PRESENT ILLNESS  10/15/17 10:56 PM [name] is a 57 y.o. male who slammed the end of his right middle finger in a dishwasher at work 2 days ago.  He has bleeding under the nail of that finger.  The finger is tender and throbbing.  He rates his pain as a 3 out of 10, worse with palpation or movement.  His tetanus is up-to-date.   Past Medical History:  Diagnosis Date  . Kidney stone     Past Surgical History:  Procedure Laterality Date  . kidney stone removal Left 2001    No family history on file.  Social History   Tobacco Use  . Smoking status: Never Smoker  . Smokeless tobacco: Never Used  Substance Use Topics  . Alcohol use: No    Frequency: Never  . Drug use: No    Prior to Admission medications   Not on File    Allergies Patient has no known allergies.   REVIEW OF SYSTEMS  Negative except as noted here or in the History of Present Illness.   PHYSICAL EXAMINATION  Initial Vital Signs Blood pressure (!) 135/99, pulse 84, temperature (!) 97.5 F (36.4 C), temperature source Oral, resp. rate 18, height 6' (1.829 m), weight 98.7 kg (217 lb 9.5 oz), SpO2 98 %.  Examination General: Well-developed, well-nourished male in no acute distress; appearance consistent with age of record HENT: normocephalic; atraumatic Eyes: Normal appearance Neck: supple Heart: regular rate and rhythm Lungs: clear to auscultation bilaterally Abdomen: soft; nondistended present Extremities: No deformity; full range of motion; tenderness, swelling and subungual hematoma of distal right middle finger:  Neurologic: Awake, alert and oriented; motor function intact in all extremities and symmetric; no facial droop Skin: Warm and dry Psychiatric: Normal mood and affect   RESULTS  Summary of  this visit's results, reviewed by myself:   EKG Interpretation  Date/Time:    Ventricular Rate:    PR Interval:    QRS Duration:   QT Interval:    QTC Calculation:   R Axis:     Text Interpretation:        Laboratory Studies: No results found for this or any previous visit (from the past 24 hour(s)). Imaging Studies: Dg Finger Middle Right  Result Date: 10/15/2017 CLINICAL DATA:  Right middle finger injury 2 days ago while at work. Dish washer slammed into the finger. Bruising and swelling of the nail bed. EXAM: RIGHT MIDDLE FINGER 2+V COMPARISON:  None. FINDINGS: There is no evidence of fracture or dislocation. Soft tissue swelling over the dorsum of the right middle finger at the level of the distal phalanx. No soft tissue avulsion. No radiopaque foreign body. IMPRESSION: Soft tissue swelling involving the dorsum of the right middle finger at the level of the distal phalanx. No fracture or joint dislocation. Electronically Signed   By: Tollie Ethavid  Kwon M.D.   On: 10/15/2017 22:50    ED COURSE  Nursing notes and initial vitals signs, including pulse oximetry, reviewed.  Vitals:   10/15/17 2212 10/15/17 2214  BP: (!) 135/99   Pulse: 84   Resp: 18   Temp: (!) 97.5 F (36.4 C)   TempSrc: Oral   SpO2: 98%   Weight:  98.7 kg (217 lb 9.5 oz)  Height:  6' (1.829 m)  PROCEDURES   DRAINAGE OF SUBUNGUAL HEMATOMA After verbal consent was obtained a standard pen cautery was used to puncture the nail of the patient's right middle finger.  There was immediate release of a significant amount of blood.  The patient tolerated this well and there were no immediate complications.  ED DIAGNOSES     ICD-10-CM   1. Crushing injury of finger, initial encounter S67.10XA   2. Subungual hematoma of digit of hand, initial encounter S60.10XA        Jahne Krukowski, MD 10/15/17 2308

## 2018-01-06 ENCOUNTER — Emergency Department (HOSPITAL_BASED_OUTPATIENT_CLINIC_OR_DEPARTMENT_OTHER)
Admission: EM | Admit: 2018-01-06 | Discharge: 2018-01-06 | Disposition: A | Discharge status: Home / Self Care | Payer: Self-pay | Attending: Emergency Medicine | Admitting: Emergency Medicine

## 2018-01-06 ENCOUNTER — Encounter (HOSPITAL_BASED_OUTPATIENT_CLINIC_OR_DEPARTMENT_OTHER): Payer: Self-pay

## 2018-01-06 ENCOUNTER — Emergency Department (HOSPITAL_BASED_OUTPATIENT_CLINIC_OR_DEPARTMENT_OTHER): Payer: Self-pay

## 2018-01-06 DIAGNOSIS — J189 Pneumonia, unspecified organism: Secondary | ICD-10-CM | POA: Insufficient documentation

## 2018-01-06 MED ORDER — ALBUTEROL SULFATE (2.5 MG/3ML) 0.083% IN NEBU
2.5000 mg | INHALATION_SOLUTION | Freq: Once | RESPIRATORY_TRACT | Status: AC
  Administered 2018-01-06: 2.5 mg via RESPIRATORY_TRACT
  Filled 2018-01-06: qty 3

## 2018-01-06 MED ORDER — AZITHROMYCIN 250 MG PO TABS: ORAL_TABLET | ORAL | 0 refills | Status: DC

## 2018-01-06 MED ORDER — ALBUTEROL SULFATE HFA 108 (90 BASE) MCG/ACT IN AERS
2.0000 | INHALATION_SPRAY | RESPIRATORY_TRACT | Status: DC | PRN
  Administered 2018-01-06: 2 via RESPIRATORY_TRACT
  Filled 2018-01-06: qty 6.7

## 2018-01-06 MED FILL — AZITHROMYCIN 250 MG TABLET: 250 | 5 days supply | Qty: 6 | Fill #0

## 2018-01-06 NOTE — ED Provider Notes (Signed)
[facility] EMERGENCY DEPARTMENT Provider Note   CSN: [id number] Arrival date & time: 01/06/18  0751     History   Chief Complaint Chief Complaint  Patient presents with  . Cough    HPI [name] is a 57 y.o. male with no pertinent past medical history presenting with 4 days of nonproductive cough with subjective fever and chills and myalgias.  Patient reports trying NyQuil and DayQuil without relief.  Denies any known ill contacts.  Patient has not received a flu shot this year.  He does report some shortness of breath on exertion and chest wall pain when coughing but not at this time.  No chest pain, nausea, vomiting.   HPI  Past Medical History:  Diagnosis Date  . Kidney stone     Patient Active Problem List   Diagnosis Date Noted  . Left knee pain 12/05/2016    Past Surgical History:  Procedure Laterality Date  . kidney stone removal Left 2001        Home Medications    Prior to Admission medications   Medication Sig Start Date End Date Taking? Authorizing Provider  azithromycin (ZITHROMAX Z-PAK) 250 MG tablet (2 Tablet) 500 mg orally on day 1 followed by 250 mg/day orally on days 2 to 5 01/06/18   Georgiana Shore, PA-C    Family History No family history on file.  Social History Social History   Tobacco Use  . Smoking status: Never Smoker  . Smokeless tobacco: Never Used  Substance Use Topics  . Alcohol use: No    Frequency: Never  . Drug use: No     Allergies   Patient has no known allergies.   Review of Systems Review of Systems  Constitutional: Positive for chills and fever. Negative for activity change, appetite change, diaphoresis and fatigue.  HENT: Negative for congestion, ear pain, sinus pressure, sore throat, tinnitus, trouble swallowing and voice change.   Respiratory: Positive for shortness of breath. Negative for cough, choking, chest tightness, wheezing and stridor.        Patient reports shortness of breath on  exertion but not at this time.  Cardiovascular: Positive for chest pain. Negative for palpitations and leg swelling.       Patient reports soreness in his chest when he coughs but no chest pain otherwise.   Gastrointestinal: Negative for abdominal distention, abdominal pain, diarrhea, nausea and vomiting.  Genitourinary: Negative for difficulty urinating.  Musculoskeletal: Positive for myalgias. Negative for neck pain and neck stiffness.  Skin: Negative for color change, pallor and rash.  Neurological: Negative for dizziness, tremors, weakness, light-headedness and headaches.     Physical Exam Updated Vital Signs BP 125/88 (BP Location: Right Arm)   Pulse 82   Temp 98.7 F (37.1 C) (Oral)   Resp 18   Ht 6' (1.829 m)   Wt 98.4 kg (217 lb)   SpO2 99%   BMI 29.43 kg/m   Physical Exam  Constitutional: He appears well-developed and well-nourished. No distress.  Afebrile, nontoxic-appearing, speaking in full sentences without increased work of breathing.  Patient is sitting comfortably in bed no acute distress. O2 sats 96% on room air on my assessment.  HENT:  Head: Normocephalic and atraumatic.  Eyes: Conjunctivae and EOM are normal. Right eye exhibits no discharge. Left eye exhibits no discharge.  Neck: Normal range of motion. Neck supple.  Cardiovascular: Normal rate, regular rhythm and normal heart sounds.  No murmur heard. Pulmonary/Chest: Effort normal. No stridor. No respiratory  distress. He has wheezes.  Decreased lung sounds in the left lower field.  Mild expiratory wheezing on the right.  Abdominal: Soft. He exhibits no distension and no mass. There is no tenderness. There is no guarding.  Musculoskeletal: Normal range of motion. He exhibits no edema.  Lymphadenopathy:    He has cervical adenopathy.  Neurological: He is alert. He exhibits normal muscle tone.  Skin: Skin is warm and dry. No rash noted. He is not diaphoretic. No erythema. No pallor.  Psychiatric: He has a  normal mood and affect.  Nursing note and vitals reviewed.    ED Treatments / Results  Labs (all labs ordered are listed, but only abnormal results are displayed) Labs Reviewed - No data to display  EKG None  Radiology Dg Chest 2 View  Result Date: 01/06/2018 CLINICAL DATA:  Cough, fever, shortness of breath EXAM: CHEST - 2 VIEW COMPARISON:  Chest radiograph 08/03/2008. FINDINGS: Stable cardiac and mediastinal contours. Elevation left hemidiaphragm. Bibasilar heterogeneous pulmonary opacities. No pleural effusion or pneumothorax. Regional skeleton is unremarkable. IMPRESSION: Elevation left hemidiaphragm with bibasilar heterogeneous opacities favored to represent atelectasis. Infection not excluded. Electronically Signed   By: Annia Belt M.D.   On: 01/06/2018 09:04    Procedures Procedures (including critical care time)  Medications Ordered in ED Medications  albuterol (PROVENTIL HFA;VENTOLIN HFA) 108 (90 Base) MCG/ACT inhaler 2 puff (2 puffs Inhalation Given 01/06/18 1109)  albuterol (PROVENTIL) (2.5 MG/3ML) 0.083% nebulizer solution 2.5 mg (2.5 mg Nebulization Given 01/06/18 1047)     Initial Impression / Assessment and Plan / ED Course  I have reviewed the triage vital signs and the nursing notes.  Pertinent labs & imaging results that were available during my care of the patient were reviewed by me and considered in my medical decision making (see chart for details).    Pt CXR concerning for possible early pneumonia. Elevated left diaphragm accouting for decreased lung sounds on exam in left lower field.  Nebulizing treatment given in the ED with improvement. Patient is well-appearing, nontoxic afebrile.  Could not use Curb 65 due to BUN missing, but patient would score 0 otherwise. Patient is low risk and appropriate candidate for outpatient therapy.  Patient will be discharged home with MDI and antibiotics with close follow-up with PCP. Patient was well-appearing, nontoxic  afebrile and hemodynamically stable.  SPO2 99% on room air prior to discharge. Final Clinical Impressions(s) / ED Diagnoses   Final diagnoses:  Community acquired pneumonia, unspecified laterality    ED Discharge Orders        Ordered    azithromycin (ZITHROMAX Z-PAK) 250 MG tablet     01/06/18 1104       Gregary Cromer 01/06/18 1131    Melene Plan, DO 01/06/18 1556

## 2018-01-06 NOTE — ED Notes (Signed)
NAD at this time. Pt is stable and going home.  

## 2018-01-06 NOTE — Discharge Instructions (Addendum)
As discussed, take your entire course of antibiotics even if you feel better.  Make sure that you stay well-hydrated and use your inhaler as needed for wheezing or shortness of breath.  Follow-up with the wellness center to establish care with a primary care provider.  Return to the emergency department if symptoms worsen, shortness of breath, difficulty breathing, fever, chills or any other new concerning symptoms in the meantime.

## 2018-01-06 NOTE — ED Triage Notes (Signed)
Pt c/o nonproductive cough since friday

## 2018-01-12 ENCOUNTER — Encounter (HOSPITAL_BASED_OUTPATIENT_CLINIC_OR_DEPARTMENT_OTHER): Payer: Self-pay

## 2018-01-12 ENCOUNTER — Other Ambulatory Visit: Payer: Self-pay

## 2018-01-12 ENCOUNTER — Emergency Department (HOSPITAL_BASED_OUTPATIENT_CLINIC_OR_DEPARTMENT_OTHER): Payer: Self-pay

## 2018-01-12 ENCOUNTER — Emergency Department (HOSPITAL_BASED_OUTPATIENT_CLINIC_OR_DEPARTMENT_OTHER)
Admission: EM | Admit: 2018-01-12 | Discharge: 2018-01-12 | Disposition: A | Discharge status: Home / Self Care | Payer: Self-pay | Attending: Emergency Medicine | Admitting: Emergency Medicine

## 2018-01-12 DIAGNOSIS — R05 Cough: Secondary | ICD-10-CM | POA: Insufficient documentation

## 2018-01-12 DIAGNOSIS — R059 Cough, unspecified: Secondary | ICD-10-CM

## 2018-01-12 MED ORDER — IPRATROPIUM-ALBUTEROL 0.5-2.5 (3) MG/3ML IN SOLN
3.0000 mL | Freq: Four times a day (QID) | RESPIRATORY_TRACT | Status: DC
  Administered 2018-01-12: 3 mL via RESPIRATORY_TRACT
  Filled 2018-01-12: qty 3

## 2018-01-12 MED ORDER — BENZONATATE 100 MG PO CAPS: 100.0000 mg | ORAL_CAPSULE | Freq: Three times a day (TID) | ORAL | 0 refills | Status: DC

## 2018-01-12 MED FILL — BENZONATATE 100 MG CAPSULE: 100 | 7 days supply | Qty: 21 | Fill #0

## 2018-01-12 NOTE — Discharge Instructions (Signed)
Take Tessalon Perles as directed.  Make sure you are drinking plenty of fluids and staying hydrated.  Follow-up with referred [facility] in approximately 1 week.  Return to the emergency department for any fever, difficulty breathing, chest pain or any other worsening or concerning symptoms.

## 2018-01-12 NOTE — ED Provider Notes (Signed)
[facility] EMERGENCY DEPARTMENT Provider Note   CSN: [id number] Arrival date & time: 01/12/18  1435     History   Chief Complaint Chief Complaint  Patient presents with  . Cough    HPI [name] is a 57 y.o. male history of kidney stone who resents for evaluation of persistent cough.  Patient reports that he was recently diagnosed with pneumonia on 01/06/2018.  Patient states he was given antibiotics at that time and states that he completed his course.  Patient comes the ED today because he is continued to have coughing.  Patient reports that his cough is productive.  Patient states that he has not had any fever.  Patient states that sometimes the coughing will cause him to be short of breath while he is in a fit.  He states he is continued to have some nasal congestion, rhinorrhea.  He does not take any medications for the symptoms.  Patient states that he has not had any chest pain.  The history is provided by the patient.    Past Medical History:  Diagnosis Date  . Kidney stone     Patient Active Problem List   Diagnosis Date Noted  . Left knee pain 12/05/2016    Past Surgical History:  Procedure Laterality Date  . kidney stone removal Left 2001        Home Medications    Prior to Admission medications   Medication Sig Start Date End Date Taking? Authorizing Provider  azithromycin (ZITHROMAX Z-PAK) 250 MG tablet (2 Tablet) 500 mg orally on day 1 followed by 250 mg/day orally on days 2 to 5 01/06/18   Mathews Robinsons B, PA-C  benzonatate (TESSALON) 100 MG capsule Take 1 capsule (100 mg total) by mouth every 8 (eight) hours. 01/12/18   Maxwell Caul, PA-C    Family History No family history on file.  Social History Social History   Tobacco Use  . Smoking status: Never Smoker  . Smokeless tobacco: Never Used  Substance Use Topics  . Alcohol use: No    Frequency: Never  . Drug use: No     Allergies   Patient has no known  allergies.   Review of Systems Review of Systems  Constitutional: Negative for fever.  HENT: Positive for congestion.   Respiratory: Positive for cough.      Physical Exam Updated Vital Signs BP 111/86 (BP Location: Left Arm)   Pulse 65   Temp 98.2 F (36.8 C) (Oral)   Resp 14   Ht 6' (1.829 m)   Wt 98 kg (216 lb)   SpO2 97%   BMI 29.29 kg/m   Physical Exam  Constitutional: He appears well-developed and well-nourished.  HENT:  Head: Normocephalic and atraumatic.  Eyes: Conjunctivae and EOM are normal. Right eye exhibits no discharge. Left eye exhibits no discharge. No scleral icterus.  Pulmonary/Chest: Effort normal and breath sounds normal. He has no decreased breath sounds. He has no wheezes.  Symmetric chest rise.  No wheezing.  Diffuse rhonchi noted.  Able speak in full sentences without any difficulty.  Neurological: He is alert.  Skin: Skin is warm and dry.  Psychiatric: He has a normal mood and affect. His speech is normal and behavior is normal.  Nursing note and vitals reviewed.    ED Treatments / Results  Labs (all labs ordered are listed, but only abnormal results are displayed) Labs Reviewed - No data to display  EKG None  Radiology Dg Chest 2  View  Result Date: 01/12/2018 CLINICAL DATA:  Cough and congestion for 1 week. EXAM: CHEST - 2 VIEW COMPARISON:  PA and lateral chest 01/06/2018 and 08/03/2018. FINDINGS: Chronic asymmetric elevation of the left hemidiaphragm relative to the right is unchanged. Mild linear opacities in the left lung base are most consistent with atelectasis. The right lung is clear. No pneumothorax or pleural effusion. Heart size is normal. IMPRESSION: No acute disease.  Subsegmental atelectasis left lung base noted. Electronically Signed   By: Drusilla Kanner M.D.   On: 01/12/2018 14:57    Procedures Procedures (including critical care time)  Medications Ordered in ED Medications  ipratropium-albuterol (DUONEB) 0.5-2.5 (3)  MG/3ML nebulizer solution 3 mL (3 mLs Nebulization Given 01/12/18 1711)     Initial Impression / Assessment and Plan / ED Course  I have reviewed the triage vital signs and the nursing notes.  Pertinent labs & imaging results that were available during my care of the patient were reviewed by me and considered in my medical decision making (see chart for details).     57 year old male who recently diagnosed with pneumonia on 01/06/2018 presents for evaluation of cough.  States he took his antibiotics as directed but states he is continued to have persistent cough, nasal congestion.  No fevers noted.  Patient reports he has not taken any other medications. Patient is afebrile, non-toxic appearing, sitting comfortably on examination table. Vital signs reviewed and stable.  On exam, he has some diffuse rhonchi noted to the lungs.  No evidence of rales.  No wheezing noted.  Patient states that he had a breathing treatment here the other day and states that that helped him.  We will repeat.  Chest x-ray ordered at triage.  Chest x-ray reviewed.  Patient has chronic elevation of left hemidiaphragm.  Additionally, there is some linear areas that are concerning for atelectasis in the left side.  Compared to previous checks x-ray were patient had bilateral opacities, it is improved.  Repeat vitals are stable.  2 sets are greater to 95% on room air.  Suspect that this is more lingering cough from pneumonia rather than reinfection.  Will plan to give patient supportive therapies for cough.  Instructed patient to follow-up with primary care doctor. Patient had ample opportunity for questions and discussion. All patient's questions were answered with full understanding. Strict return precautions discussed. Patient expresses understanding and agreement to plan.    Final Clinical Impressions(s) / ED Diagnoses   Final diagnoses:  Cough    ED Discharge Orders        Ordered    benzonatate (TESSALON) 100 MG  capsule  Every 8 hours     01/12/18 1729       Rosana Hoes 01/12/18 Melrose Nakayama, MD 01/13/18 [phone]

## 2018-01-12 NOTE — ED Triage Notes (Signed)
Pt sates recent dx PNE-states he feels no better and has cont;d to cough-NAD-steady gait

## 2019-02-01 ENCOUNTER — Emergency Department (HOSPITAL_BASED_OUTPATIENT_CLINIC_OR_DEPARTMENT_OTHER)
Admission: EM | Admit: 2019-02-01 | Discharge: 2019-02-01 | Disposition: A | Discharge status: Home / Self Care | Payer: BLUE CROSS/BLUE SHIELD | Attending: Emergency Medicine | Admitting: Emergency Medicine

## 2019-02-01 ENCOUNTER — Encounter (HOSPITAL_BASED_OUTPATIENT_CLINIC_OR_DEPARTMENT_OTHER): Payer: Self-pay | Admitting: *Deleted

## 2019-02-01 ENCOUNTER — Other Ambulatory Visit: Payer: Self-pay

## 2019-02-01 ENCOUNTER — Emergency Department (HOSPITAL_BASED_OUTPATIENT_CLINIC_OR_DEPARTMENT_OTHER): Payer: BLUE CROSS/BLUE SHIELD

## 2019-02-01 DIAGNOSIS — R0789 Other chest pain: Secondary | ICD-10-CM | POA: Insufficient documentation

## 2019-02-01 DIAGNOSIS — R079 Chest pain, unspecified: Secondary | ICD-10-CM

## 2019-02-01 LAB — COMPREHENSIVE METABOLIC PANEL
ALT: 47 U/L — ABNORMAL HIGH (ref 0–44)
AST: 35 U/L (ref 15–41)
Albumin: 4.5 g/dL (ref 3.5–5.0)
Alkaline Phosphatase: 80 U/L (ref 38–126)
Anion gap: 9 (ref 5–15)
BUN: 16 mg/dL (ref 6–20)
CO2: 24 mmol/L (ref 22–32)
Calcium: 9.6 mg/dL (ref 8.9–10.3)
Chloride: 106 mmol/L (ref 98–111)
Creatinine, Ser: 1.14 mg/dL (ref 0.61–1.24)
GFR calc Af Amer: >60 mL/min (ref >60)
GFR calc non Af Amer: >60 mL/min (ref >60)
Glucose, Bld: 95 mg/dL (ref 70–99)
Potassium: 3.7 mmol/L (ref 3.5–5.1)
Sodium: 139 mmol/L (ref 135–145)
Total Bilirubin: 0.9 mg/dL (ref 0.3–1.2)
Total Protein: 8.2 g/dL — ABNORMAL HIGH (ref 6.5–8.1)

## 2019-02-01 LAB — TROPONIN I
Troponin I: <0.03 ng/mL (ref <0.03)
Troponin I: <0.03 ng/mL (ref <0.03)

## 2019-02-01 LAB — CBC WITH DIFFERENTIAL/PLATELET
Abs Immature Granulocytes: 0.02 10*3/uL (ref 0.00–0.07)
Basophils Absolute: 0 10*3/uL (ref 0.0–0.1)
Basophils Relative: 1 %
Eosinophils Absolute: 0.1 10*3/uL (ref 0.0–0.5)
Eosinophils Relative: 2 %
HCT: 45.3 % (ref 39.0–52.0)
Hemoglobin: 14.3 g/dL (ref 13.0–17.0)
Immature Granulocytes: 0 %
Lymphocytes Relative: 26 %
Lymphs Abs: 1.6 10*3/uL (ref 0.7–4.0)
MCH: 30 pg (ref 26.0–34.0)
MCHC: 31.6 g/dL (ref 30.0–36.0)
MCV: 95 fL (ref 80.0–100.0)
Monocytes Absolute: 0.4 10*3/uL (ref 0.1–1.0)
Monocytes Relative: 7 %
Neutro Abs: 3.9 10*3/uL (ref 1.7–7.7)
Neutrophils Relative %: 64 %
Platelets: 260 10*3/uL (ref 150–400)
RBC: 4.77 MIL/uL (ref 4.22–5.81)
RDW: 11.9 % (ref 11.5–15.5)
WBC: 6.1 10*3/uL (ref 4.0–10.5)
nRBC: 0 % (ref 0.0–0.2)

## 2019-02-01 LAB — D-DIMER, QUANTITATIVE (NOT AT ARMC): D-Dimer, Quant: 0.31 ug/mL-FEU (ref 0.00–0.50)

## 2019-02-01 MED ORDER — IOHEXOL 300 MG/ML  SOLN
100.0000 mL | Freq: Once | INTRAMUSCULAR | Status: AC | PRN
  Administered 2019-02-01: 15:00:00 100 mL via INTRAVENOUS

## 2019-02-01 NOTE — ED Notes (Signed)
Pt. Just returned from CT back to room

## 2019-02-01 NOTE — ED Provider Notes (Signed)
[facility] EMERGENCY DEPARTMENT Provider Note   CSN: [id number] Arrival date & time: 02/01/19  1241    History   Chief Complaint Chief Complaint  Patient presents with  . Chest Pain    HPI [name] is a 58 y.o. male.     HPI Patient states he has had 2 weeks of intermittent bilateral upper chest pain.  Worse with certain movement and deep breathing.  He denies shortness of breath or cough.  No fever or chills.  No new lower extremity swelling or pain.  Denies family or personal CAD history.  Currently denying any chest pain. Past Medical History:  Diagnosis Date  . Kidney stone     Patient Active Problem List   Diagnosis Date Noted  . Left knee pain 12/05/2016    Past Surgical History:  Procedure Laterality Date  . kidney stone removal Left 2001        Home Medications    Prior to Admission medications   Medication Sig Start Date End Date Taking? Authorizing Provider  azithromycin (ZITHROMAX Z-PAK) 250 MG tablet (2 Tablet) 500 mg orally on day 1 followed by 250 mg/day orally on days 2 to 5 01/06/18   Mathews RobinsonsMitchell, Jessica B, PA-C  benzonatate (TESSALON) 100 MG capsule Take 1 capsule (100 mg total) by mouth every 8 (eight) hours. 01/12/18   Maxwell CaulLayden, Lindsey A, PA-C    Family History No family history on file.  Social History Social History   Tobacco Use  . Smoking status: Never Smoker  . Smokeless tobacco: Never Used  Substance Use Topics  . Alcohol use: No    Frequency: Never  . Drug use: No     Allergies   Patient has no known allergies.   Review of Systems Review of Systems  Constitutional: Negative for chills and fever.  Respiratory: Negative for cough and shortness of breath.   Cardiovascular: Positive for chest pain. Negative for palpitations and leg swelling.  Gastrointestinal: Negative for abdominal pain, constipation, diarrhea, nausea and vomiting.  Musculoskeletal: Negative for back pain, myalgias and neck pain.  Skin: Negative  for rash and wound.  Neurological: Negative for dizziness, weakness, light-headedness, numbness and headaches.  All other systems reviewed and are negative.    Physical Exam Updated Vital Signs BP (!) 137/105   Pulse 81   Temp 98.3 F (36.8 C) (Oral)   Resp (!) 22   Ht 6' (1.829 m)   Wt 102.7 kg   SpO2 92%   BMI 30.72 kg/m   Physical Exam Vitals signs and nursing note reviewed.  Constitutional:      Appearance: Normal appearance. He is well-developed.  HENT:     Head: Normocephalic and atraumatic.  Eyes:     Pupils: Pupils are equal, round, and reactive to light.  Neck:     Musculoskeletal: Normal range of motion and neck supple.  Cardiovascular:     Rate and Rhythm: Normal rate and regular rhythm.     Heart sounds: No murmur. No friction rub. No gallop.   Pulmonary:     Effort: Pulmonary effort is normal. No respiratory distress.     Breath sounds: Normal breath sounds. No stridor. No wheezing, rhonchi or rales.  Chest:     Chest wall: No tenderness.  Abdominal:     General: Bowel sounds are normal.     Palpations: Abdomen is soft.     Tenderness: There is no abdominal tenderness. There is no guarding or rebound.  Musculoskeletal: Normal range  of motion.        General: No tenderness.     Right lower leg: No edema.     Left lower leg: No edema.     Comments: No lower extremity swelling, asymmetry or tenderness.  Distal pulses intact.  Skin:    General: Skin is warm and dry.     Findings: No erythema or rash.  Neurological:     Mental Status: He is alert and oriented to person, place, and time.  Psychiatric:        Behavior: Behavior normal.      ED Treatments / Results  Labs (all labs ordered are listed, but only abnormal results are displayed) Labs Reviewed  COMPREHENSIVE METABOLIC PANEL - Abnormal; Notable for the following components:      Result Value   Total Protein 8.2 (*)    ALT 47 (*)    All other components within normal limits  CBC WITH  DIFFERENTIAL/PLATELET  TROPONIN I  D-DIMER, QUANTITATIVE (NOT AT [facility])    EKG EKG Interpretation  Date/Time:  Monday February 01 2019 12:54:43 EDT Ventricular Rate:  83 PR Interval:    QRS Duration: 147 QT Interval:  409 QTC Calculation: 481 R Axis:   -85 Text Interpretation:  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 EXAM: CHEST - 2 VIEW COMPARISON:  Jan 12, 2018 FINDINGS: Elevation left hemidiaphragm remains. Opacity in left base adjacent to the elevated hemidiaphragm is likely atelectasis. A small associated effusion is not excluded. No other interval changes. IMPRESSION: Opacity adjacent to the left hemidiaphragm is similar in the interval, likely largely explained by atelectasis. A small associated effusion may be present. No other interval changes. Electronically Signed   By: Gerome Sam III M.D   On: 02/01/2019 14:08    Procedures Procedures (including critical care time)  Medications Ordered in ED Medications  iohexol (OMNIPAQUE) 300 MG/ML solution 100 mL (100 mLs Intravenous Contrast Given 02/01/19 1430)     Initial Impression / Assessment and Plan / ED Course  I have reviewed the triage vital signs and the nursing notes.  Pertinent labs & imaging results that were available during my care of the patient were reviewed by me and considered in my medical decision making (see chart for details).        Chest pain is very atypical.  Low suspicion for CAD.  Patient does have some risk factors.  Initial troponin is normal.  Will need 3-hour repeat troponin.   CT chest with what appears to be left-sided hiatal hernia.  Awaiting radiology read.  Signed out to oncoming emergency provider.  Anticipate discharge home to follow-up with cardiology.  May need to be placed on PPI.  Final Clinical Impressions(s) / ED Diagnoses   Final diagnoses:  Nonspecific chest pain    ED Discharge Orders    None       Loren Racer, MD 02/01/19 1459

## 2019-02-01 NOTE — ED Triage Notes (Signed)
Pain across his upper anterior chest x 2 weeks. No injury. States pain is produced by certain movements.

## 2019-02-01 NOTE — ED Notes (Signed)
Pt. Reports he has had pain in the upper portion of his chest at the Clavicle where he has had  Pain for 3 weeks and rates the pain a 3/10.   No shortness breath and no vomiting.  Pt. Also states no dizziness.

## 2019-02-01 NOTE — ED Notes (Signed)
Bilateral upper chest pain x 2 weeks  Increased w movement  Denies inj

## 2019-02-01 NOTE — Discharge Instructions (Addendum)
You can take Tylenol as needed for the pain.  It will be very important for you to get a regular doctor so that they can continue to monitor your blood pressure because it was slightly elevated today.

## 2019-03-06 IMAGING — CR DG CHEST 2V
2 series · 2 of 2 positions shown · non-contrast
Comparison: PA and lateral chest 01/06/2018 and 08/03/2018.

CLINICAL DATA: Cough and congestion for 1 week.

EXAM:
CHEST - 2 VIEW

[w chest pa]
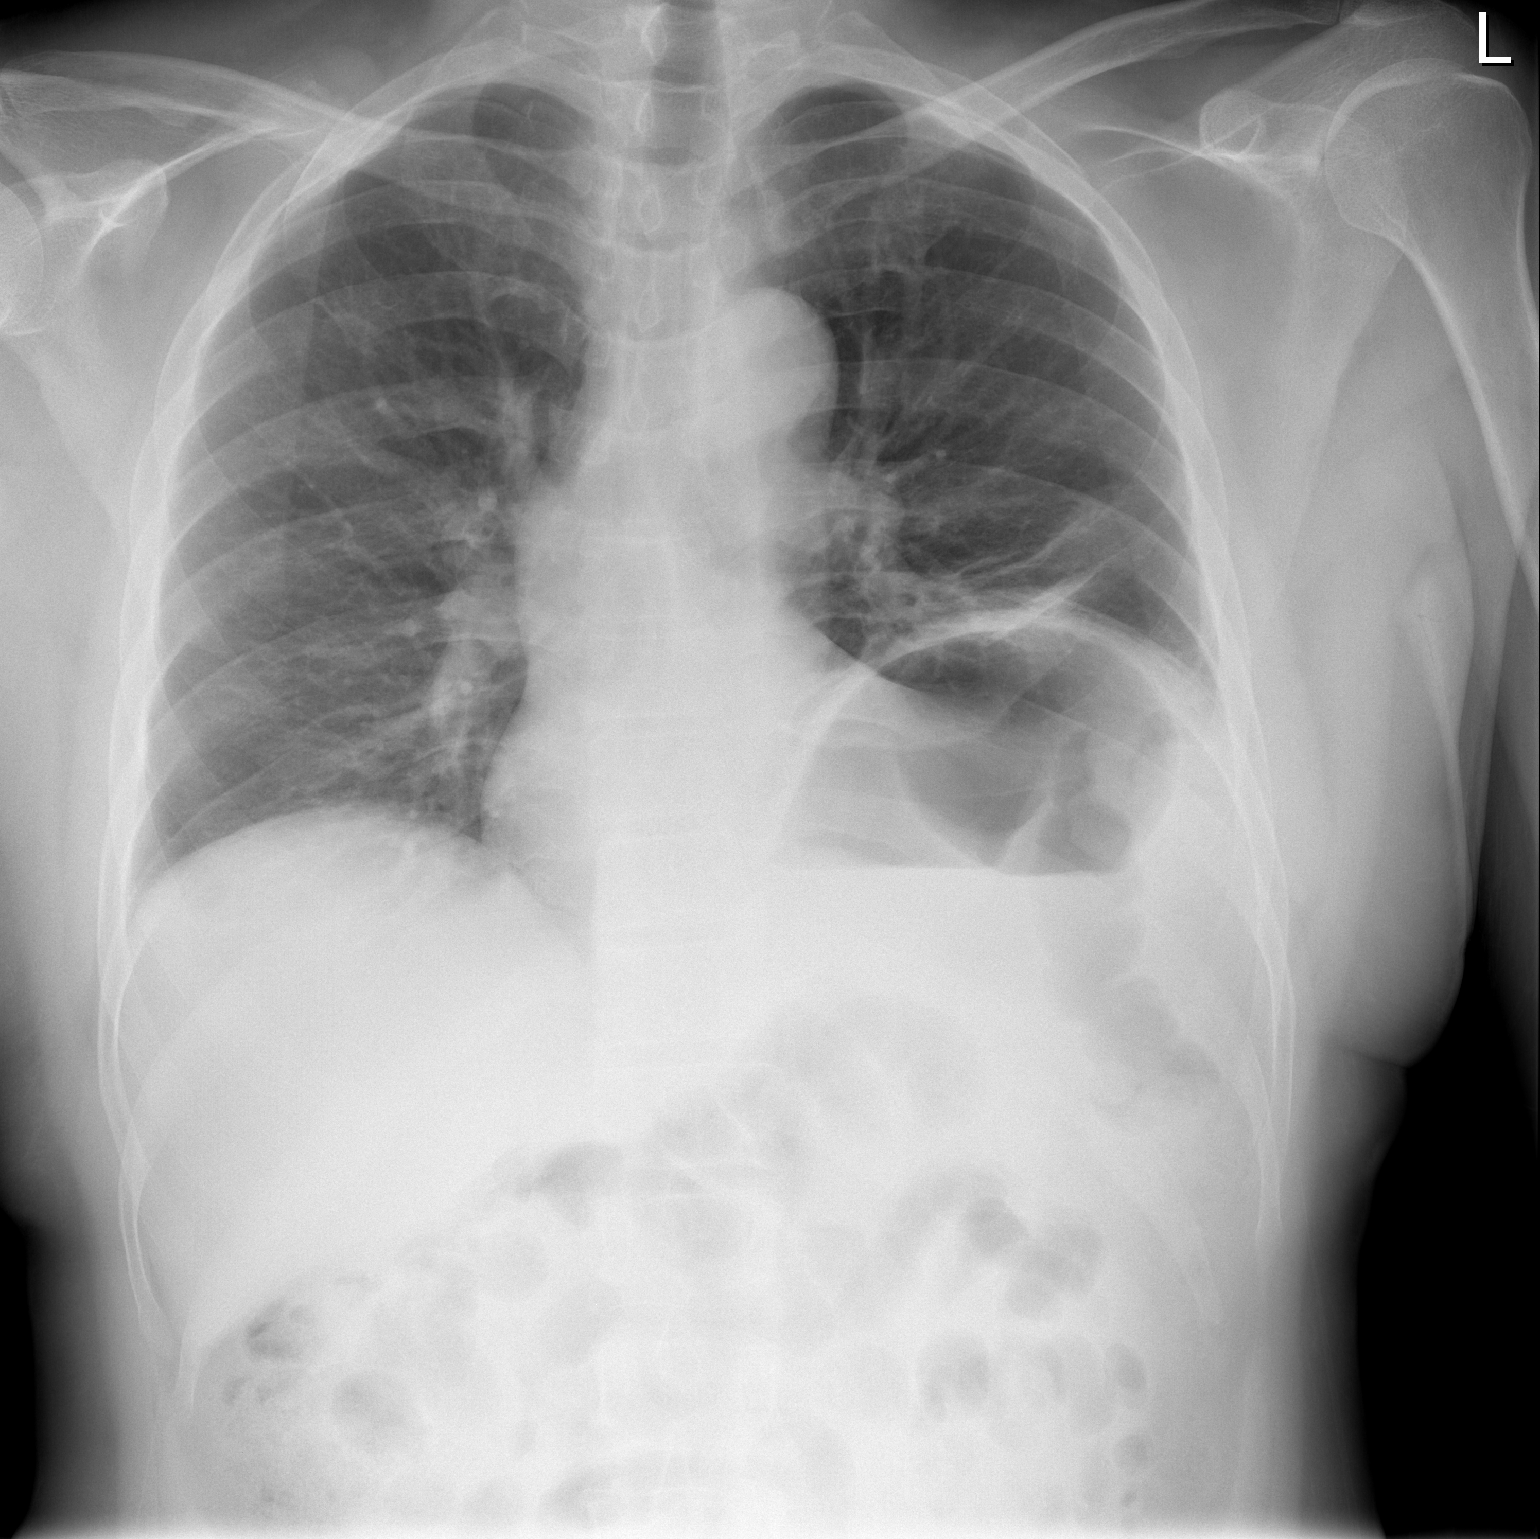

[w chest lat]
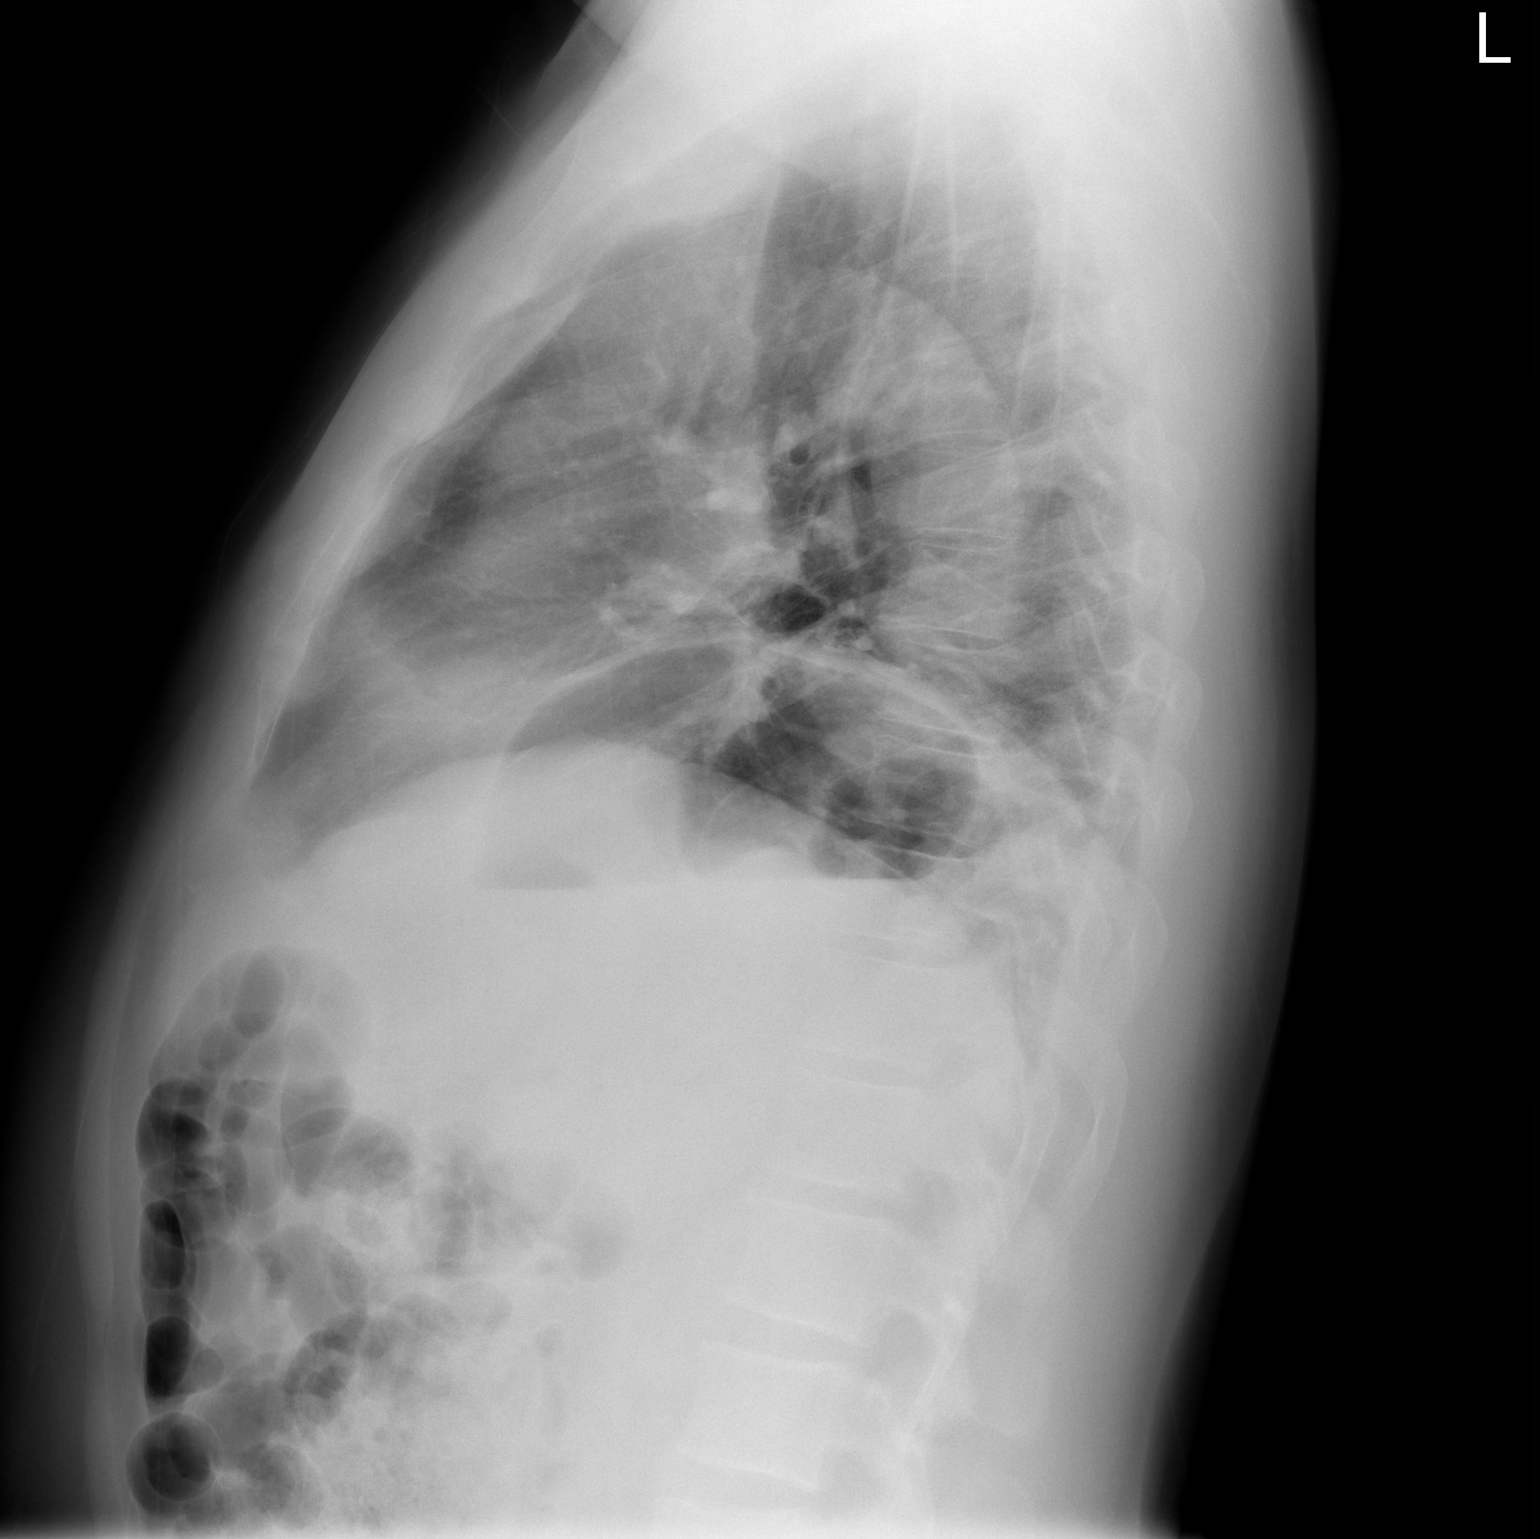

[2 of 2 positions shown; findings below may reference images not displayed]

FINDINGS: Chronic asymmetric elevation of the left hemidiaphragm relative to
the right is unchanged. Mild linear opacities in the left lung base
are most consistent with atelectasis. The right lung is clear. No
pneumothorax or pleural effusion. Heart size is normal.
IMPRESSION: No acute disease.  Subsegmental atelectasis left lung base noted.

## 2019-09-10 ENCOUNTER — Emergency Department (HOSPITAL_BASED_OUTPATIENT_CLINIC_OR_DEPARTMENT_OTHER)
Admission: EM | Admit: 2019-09-10 | Discharge: 2019-09-10 | Disposition: A | Discharge status: Home / Self Care | Payer: BC Managed Care – PPO | Attending: Emergency Medicine | Admitting: Emergency Medicine

## 2019-09-10 ENCOUNTER — Other Ambulatory Visit: Payer: Self-pay

## 2019-09-10 ENCOUNTER — Encounter (HOSPITAL_BASED_OUTPATIENT_CLINIC_OR_DEPARTMENT_OTHER): Payer: Self-pay | Admitting: Emergency Medicine

## 2019-09-10 DIAGNOSIS — M25562 Pain in left knee: Secondary | ICD-10-CM | POA: Insufficient documentation

## 2019-09-10 DIAGNOSIS — Z79899 Other long term (current) drug therapy: Secondary | ICD-10-CM | POA: Insufficient documentation

## 2019-09-10 DIAGNOSIS — M25561 Pain in right knee: Secondary | ICD-10-CM | POA: Diagnosis present

## 2019-09-10 MED ORDER — MELOXICAM 15 MG PO TABS: ORAL_TABLET | ORAL | 0 refills | Status: DC

## 2019-09-10 MED ORDER — NAPROXEN 250 MG PO TABS
500.0000 mg | ORAL_TABLET | Freq: Once | ORAL | Status: AC
  Administered 2019-09-10: 02:00:00 500 mg via ORAL
  Filled 2019-09-10: qty 2

## 2019-09-10 NOTE — ED Provider Notes (Signed)
[facility] Provider Note: Lowella Dell, MD, FACEP  CSN: [id number] MRN: [id number] ARRIVAL: 09/10/19 at 0115 ROOM: [facility]12/[facility]   CHIEF COMPLAINT  Knee Pain (swelling)   HISTORY OF PRESENT ILLNESS  09/10/19 1:34 AM [name] is a 59 y.o. male without significant past medical history.  He began work at [employer] recently.  He has been doing work that involves more movement and lifting than he is accustomed to.  He is here with pain in his knees, left greater than right.  He rates the pain in the left is a 5 out of 10 on the pain in the right is a 4 out of 10.  Pain is worse with movement particularly lifting.  There is some swelling of the left knee as well.  He denies any specific injury or sudden onset of the pain.  He has no known history of arthritis.   Past Medical History:  Diagnosis Date  . Kidney stone     Past Surgical History:  Procedure Laterality Date  . kidney stone removal Left 2001    History reviewed. No pertinent family history.  Social History   Tobacco Use  . Smoking status: Never Smoker  . Smokeless tobacco: Never Used  Substance Use Topics  . Alcohol use: No  . Drug use: No    Prior to Admission medications   Medication Sig Start Date End Date Taking? Authorizing Provider  meloxicam (MOBIC) 15 MG tablet Take 1 tablet daily as needed for knee pain. 09/10/19   Scarlet Abad, Jonny Ruiz, MD    Allergies Patient has no known allergies.   REVIEW OF SYSTEMS  Negative except as noted here or in the History of Present Illness.   PHYSICAL EXAMINATION  Initial Vital Signs Blood pressure (!) 144/103, pulse 74, temperature 98 F (36.7 C), temperature source Oral, resp. rate 18, height 6' (1.829 m), weight 106.6 kg, SpO2 95 %.  Examination General: Well-developed, well-nourished male in no acute distress; appearance consistent with age of record HENT: normocephalic; atraumatic Eyes: Normal appearance Neck: supple Heart: regular rate and rhythm Lungs:  clear to auscultation bilaterally Abdomen: soft; nondistended; nontender; bowel sounds present Extremities: No deformity; full range of motion; pulses normal; pain on movement of knees bilaterally with small effusion of left knee Neurologic: Awake, alert and oriented; motor function intact in all extremities and symmetric; no facial droop Skin: Warm and dry Psychiatric: Normal mood and affect   RESULTS  Summary of this visit's results, reviewed and interpreted by myself:   EKG Interpretation  Date/Time:    Ventricular Rate:    PR Interval:    QRS Duration:   QT Interval:    QTC Calculation:   R Axis:     Text Interpretation:        Laboratory Studies: No results found for this or any previous visit (from the past 24 hour(s)). Imaging Studies: No results found.  ED COURSE and MDM  Nursing notes, initial and subsequent vitals signs, including pulse oximetry, reviewed and interpreted by myself.  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  Since there was no significant injury I do not believe radiographs would be helpful in this setting.  We will start him on an NSAID and refer to sports medicine.  PROCEDURES  Procedures   ED DIAGNOSES     ICD-10-CM   1. Acute pain of both knees  M25.561    M25.562        Aleesia Henney, Jenny Reichmann, MD 09/10/19 0151

## 2019-09-10 NOTE — ED Triage Notes (Signed)
Pt reports sudden onset bilateral knee pain and swelling. Denies injury, states he just started working at [employer].

## 2019-09-14 ENCOUNTER — Emergency Department (HOSPITAL_BASED_OUTPATIENT_CLINIC_OR_DEPARTMENT_OTHER)
Admission: EM | Admit: 2019-09-14 | Discharge: 2019-09-14 | Disposition: A | Discharge status: Home / Self Care | Payer: BC Managed Care – PPO | Attending: Emergency Medicine | Admitting: Emergency Medicine

## 2019-09-14 ENCOUNTER — Other Ambulatory Visit: Payer: Self-pay

## 2019-09-14 ENCOUNTER — Encounter (HOSPITAL_BASED_OUTPATIENT_CLINIC_OR_DEPARTMENT_OTHER): Payer: Self-pay | Admitting: Emergency Medicine

## 2019-09-14 DIAGNOSIS — Z79899 Other long term (current) drug therapy: Secondary | ICD-10-CM | POA: Insufficient documentation

## 2019-09-14 DIAGNOSIS — M25562 Pain in left knee: Secondary | ICD-10-CM | POA: Diagnosis present

## 2019-09-14 DIAGNOSIS — M25561 Pain in right knee: Secondary | ICD-10-CM | POA: Insufficient documentation

## 2019-09-14 NOTE — Discharge Instructions (Addendum)
Take the meloxicam at 3 PM with a meal before your shift. You may take Tylenol, available over-the-counter according to label instructions if you still have pain.

## 2019-09-14 NOTE — ED Provider Notes (Signed)
[facility] EMERGENCY DEPARTMENT Provider Note   CSN: [id number] Arrival date & time: 09/14/19  0930     History Chief Complaint  Patient presents with  . Knee Pain    [name] is a 59 y.o. male.  The history is provided by the patient and medical records. No language interpreter was used.  Knee Pain  [name] is a 59 y.o. male who presents to the Emergency Department complaining of knee pain. He presents the emergency department complaining of bilateral knee pain. He reports several months to several years of bilateral knee pain that is been filed in nature. About one month ago he started a job at [employer] and he is performing a lot more bending, moving cans on shelves. He denies any heavy lifting. He states that since beginning this job he has increased pain in his knees when he bends. He denies any fevers, night sweats, chest pain, shortness of breath, nausea, vomiting. He feels like they are mildly swollen.    Past Medical History:  Diagnosis Date  . Kidney stone     Patient Active Problem List   Diagnosis Date Noted  . Left knee pain 12/05/2016    Past Surgical History:  Procedure Laterality Date  . kidney stone removal Left 2001       No family history on file.  Social History   Tobacco Use  . Smoking status: Never Smoker  . Smokeless tobacco: Never Used  Substance Use Topics  . Alcohol use: No  . Drug use: No    Home Medications Prior to Admission medications   Medication Sig Start Date End Date Taking? Authorizing Provider  meloxicam (MOBIC) 15 MG tablet Take 1 tablet daily as needed for knee pain. 09/10/19   Molpus, Jonny Ruiz, MD    Allergies    Patient has no known allergies.  Review of Systems   Review of Systems  All other systems reviewed and are negative.   Physical Exam Updated Vital Signs BP (!) 137/95 (BP Location: Left Arm)   Pulse 91   Temp 97.9 F (36.6 C) (Oral)   Resp 16   Ht 6' (1.829 m)   Wt 104.6 kg   SpO2 95%    BMI 31.28 kg/m   Physical Exam Vitals and nursing note reviewed.  Constitutional:      Appearance: He is well-developed.  HENT:     Head: Normocephalic and atraumatic.  Cardiovascular:     Rate and Rhythm: Normal rate and regular rhythm.  Pulmonary:     Effort: Pulmonary effort is normal. No respiratory distress.  Musculoskeletal:     Comments: 2+ DP pulses bilaterally. There is tenderness to palpation over anterior knees bilaterally. There is very mild edema to bilateral knees without overlying erythema. No palpable joint effusions. Flexion extension is intact at the knee bilaterally.  Skin:    General: Skin is warm and dry.  Neurological:     Mental Status: He is alert and oriented to person, place, and time.  Psychiatric:        Behavior: Behavior normal.     ED Results / Procedures / Treatments   Labs (all labs ordered are listed, but only abnormal results are displayed) Labs Reviewed - No data to display  EKG None  Radiology No results found.  Procedures Procedures (including critical care time)  Medications Ordered in ED Medications - No data to display  ED Course  I have reviewed the triage vital signs and the nursing notes.  Pertinent labs & imaging results that were available during my care of the patient were reviewed by me and considered in my medical decision making (see chart for details).    MDM Rules/Calculators/A&P                     Patient here for evaluation of acute on chronic bilateral knee pain. He was prescribed meloxicam on his last emergency department visit but has not been taking the due to confusion over the instructions. Current examination is not consistent with acute fracture, septic arthritis, gouty arthritis. Discussed with patient how to take the meloxicam as directed. Discussed that he may also take acetaminophen if he has ongoing pain. Discussed sports medicine follow-up if he has ongoing symptoms. Return precautions  discussed.  Final Clinical Impression(s) / ED Diagnoses Final diagnoses:  Acute pain of both knees    Rx / DC Orders ED Discharge Orders    None       Quintella Reichert, MD 09/14/19 1026

## 2019-09-14 NOTE — ED Triage Notes (Signed)
Pt sts he was seen here a couple days ago for knee pain and it is no better.  Started a new job at [employer] and he bends over a lot and it is bothering his knees.

## 2019-09-21 ENCOUNTER — Ambulatory Visit: Payer: Self-pay

## 2019-09-21 ENCOUNTER — Encounter: Payer: Self-pay | Admitting: Family Medicine

## 2019-09-21 ENCOUNTER — Other Ambulatory Visit: Payer: Self-pay

## 2019-09-21 ENCOUNTER — Ambulatory Visit (INDEPENDENT_AMBULATORY_CARE_PROVIDER_SITE_OTHER): Payer: BC Managed Care – PPO | Admitting: Family Medicine

## 2019-09-21 VITALS — BP 147/95 | HR 88 | Ht 72.0 in | Wt 225.0 lb

## 2019-09-21 DIAGNOSIS — M25561 Pain in right knee: Secondary | ICD-10-CM

## 2019-09-21 DIAGNOSIS — G8929 Other chronic pain: Secondary | ICD-10-CM | POA: Diagnosis not present

## 2019-09-21 DIAGNOSIS — M25562 Pain in left knee: Secondary | ICD-10-CM | POA: Diagnosis not present

## 2019-09-21 DIAGNOSIS — M25461 Effusion, right knee: Secondary | ICD-10-CM | POA: Insufficient documentation

## 2019-09-21 MED ORDER — IBUPROFEN-FAMOTIDINE 800-26.6 MG PO TABS: 1.0000 | ORAL_TABLET | Freq: Three times a day (TID) | ORAL | 3 refills | Status: DC

## 2019-09-21 NOTE — Progress Notes (Signed)
[name] - 59 y.o. male MRN [id number]  Date of birth: 1961-02-02  SUBJECTIVE:  Including CC & ROS.  Chief Complaint  Patient presents with  . Knee Pain    bilateral knee    [name] is a 59 y.o. male that is presenting with acute on chronic bilateral knee pain.  The left knee seems to be worse than the right.  The pain is localized to the knee.  No inciting event or trauma.  Seems to be worse with walking up and down stairs or getting in a squatting position.  He is a Nature conservation officer at [employer].  No history of surgery.  Has not tried physical therapy.  No improvement with meloxicam..  Independent review of the left knee x-ray from 2018 shows no significant effusion.  No significant degenerative changes.   Review of Systems See HPI   HISTORY: Past Medical, Surgical, Social, and Family History Reviewed & Updated per EMR.   Pertinent Historical Findings include:  Past Medical History:  Diagnosis Date  . Kidney stone     Past Surgical History:  Procedure Laterality Date  . kidney stone removal Left 2001    No Known Allergies  No family history on file.   Social History   Socioeconomic History  . Marital status: Married    Spouse name: Not on file  . Number of children: Not on file  . Years of education: Not on file  . Highest education level: Not on file  Occupational History  . Not on file  Tobacco Use  . Smoking status: Never Smoker  . Smokeless tobacco: Never Used  Substance and Sexual Activity  . Alcohol use: No  . Drug use: No  . Sexual activity: Not on file  Other Topics Concern  . Not on file  Social History Narrative  . Not on file   Social Determinants of Health   Financial Resource Strain:   . Difficulty of Paying Living Expenses: Not on file  Food Insecurity:   . Worried About Programme researcher, broadcasting/film/video in the Last Year: Not on file  . Ran Out of Food in the Last Year: Not on file  Transportation Needs:   . Lack of Transportation (Medical): Not on file    . Lack of Transportation (Non-Medical): Not on file  Physical Activity:   . Days of Exercise per Week: Not on file  . Minutes of Exercise per Session: Not on file  Stress:   . Feeling of Stress : Not on file  Social Connections:   . Frequency of Communication with Friends and Family: Not on file  . Frequency of Social Gatherings with Friends and Family: Not on file  . Attends Religious Services: Not on file  . Active Member of Clubs or Organizations: Not on file  . Attends Banker Meetings: Not on file  . Marital Status: Not on file  Intimate Partner Violence:   . Fear of Current or Ex-Partner: Not on file  . Emotionally Abused: Not on file  . Physically Abused: Not on file  . Sexually Abused: Not on file     PHYSICAL EXAM:  VS: BP (!) 147/95   Pulse 88   Ht 6' (1.829 m)   Wt 225 lb (102.1 kg)   BMI 30.52 kg/m  Physical Exam Gen: NAD, alert, cooperative with exam, well-appearing ENT: normal lips, normal nasal mucosa,  Eye: normal EOM, normal conjunctiva and lids Skin: no rashes, no areas of induration  Neuro: normal tone,  normal sensation to touch Psych:  normal insight, alert and oriented MSK:  Left and right knee: Left knee with obvious effusion. Normal range of motion. No tenderness to palpation over the medial or lateral joint line. No instability. Negative McMurray's test. Neurovascular intact  Limited ultrasound: Left and right knee:  Left knee: Moderate to severe effusion in the suprapatellar pouch. Normal-appearing quadricep and patellar tendon. Mild to moderate medial joint space narrowing.  No significant degenerative changes of the medial meniscus. Lateral joint space with mild to moderate joint line narrowing.  Right knee: Mild to moderate effusion in the suprapatellar pouch. Normal-appearing quadricep and patellar tendon. Mild joint space narrowing with degenerative change of the medial meniscus.   Summary: Significant effusion of  the left knee.  Mild to moderate degenerative changes appreciated in each knee  Ultrasound and interpretation by Clearance Coots, MD    ASSESSMENT & PLAN:   Acute pain of both knees Acute on chronic in nature.  Does not appear to be significant degenerative changes but does have effusions in both.  Does not appear to be gouty in nature.  Meniscus does not look terrible. -Duexis sample and prescription sent. - hinged knee brace - counseled on HEp and supportive care - if no improvement consider imaging, PT and injection.

## 2019-09-21 NOTE — Patient Instructions (Signed)
Nice to meet you Please try ice  Please try the duexis  Please try the brace  Please try the exercises   Please send me a message in MyChart with any questions or updates.  Please see me back in 4 weeeks.   --Dr. Jordan Likes

## 2019-09-21 NOTE — Progress Notes (Signed)
Medication Samples have been provided to the patient.  Drug name: Duexis       Strength: 800mg /26.6mg         Qty: 1 Box  LOT  Exp.Date: 08/2020  Dosing instructions: Take 1 tablet by mouth three (3) times a day.  The patient has been instructed regarding the correct time, dose, and frequency of taking this medication, including desired effects and most common side effects.   09/2020, Kathi Simpers 11:35 AM 09/21/2019

## 2019-09-21 NOTE — Assessment & Plan Note (Signed)
Acute on chronic in nature.  Does not appear to be significant degenerative changes but does have effusions in both.  Does not appear to be gouty in nature.  Meniscus does not look terrible. -Duexis sample and prescription sent. - hinged knee brace - counseled on HEp and supportive care - if no improvement consider imaging, PT and injection.

## 2019-09-22 ENCOUNTER — Ambulatory Visit: Payer: BC Managed Care – PPO | Admitting: Family Medicine

## 2019-09-23 ENCOUNTER — Telehealth: Payer: Self-pay | Admitting: Family Medicine

## 2019-09-23 MED ORDER — IBUPROFEN 800 MG PO TABS: 800.0000 mg | ORAL_TABLET | Freq: Three times a day (TID) | ORAL | 0 refills | Status: DC | PRN

## 2019-09-23 NOTE — Telephone Encounter (Signed)
Provided ibuprofen and dc duexis.   Myra Rude, MD [facility] 09/23/2019, 2:51 PM

## 2019-09-23 NOTE — Telephone Encounter (Signed)
Patient lost his insurance and states the Ibuprofen-Famotidine Rx is too expensive. Patient is asking if there is a more affordable medication he can take.   Preferred Pharmacy: [facility] on [address]

## 2019-09-27 ENCOUNTER — Ambulatory Visit (INDEPENDENT_AMBULATORY_CARE_PROVIDER_SITE_OTHER): Payer: BC Managed Care – PPO | Admitting: Family Medicine

## 2019-09-27 ENCOUNTER — Other Ambulatory Visit: Payer: Self-pay | Admitting: Family Medicine

## 2019-09-27 ENCOUNTER — Ambulatory Visit: Payer: Self-pay

## 2019-09-27 ENCOUNTER — Encounter: Payer: Self-pay | Admitting: Family Medicine

## 2019-09-27 ENCOUNTER — Telehealth: Payer: Self-pay | Admitting: Family Medicine

## 2019-09-27 ENCOUNTER — Other Ambulatory Visit: Payer: Self-pay

## 2019-09-27 VITALS — Ht 72.0 in | Wt 220.0 lb

## 2019-09-27 DIAGNOSIS — M25562 Pain in left knee: Secondary | ICD-10-CM

## 2019-09-27 DIAGNOSIS — M25561 Pain in right knee: Secondary | ICD-10-CM

## 2019-09-27 MED ORDER — TRIAMCINOLONE ACETONIDE 40 MG/ML IJ SUSP
40.0000 mg | Freq: Once | INTRAMUSCULAR | Status: AC
  Administered 2019-09-27: 17:00:00 40 mg via INTRA_ARTICULAR

## 2019-09-27 NOTE — Telephone Encounter (Signed)
Patient returning providers call.

## 2019-09-27 NOTE — Assessment & Plan Note (Signed)
Acute worsening of his pain with bilateral effusions.  Unclear if this is related to gout or pseudogout.  No significant degenerative changes on imaging. -Bilateral aspiration and injection today. -Counseled on supportive care. -Synovial cell analysis and crystal identification for aspiration - consider uric acid

## 2019-09-27 NOTE — Telephone Encounter (Signed)
Patient was informed of provider's advice and has been scheduled for knee injections

## 2019-09-27 NOTE — Addendum Note (Signed)
Addended by: Kathi Simpers F on: 09/27/2019 04:51 PM   Modules accepted: Orders

## 2019-09-27 NOTE — Telephone Encounter (Signed)
Patient calling with concerns. He states that his knees are still swollen and painful. He has been wearing the knee brace when at work and taking the ibuprofen as directed. He has been unable to do the exercises due to the pain.   Patient is asking what he should do regarding the continued swelling.

## 2019-09-27 NOTE — Telephone Encounter (Signed)
Left VM for patient. If he calls back please have him speak with a nurse/CMA and inform that we can do injections in his knees if his pain is not controlled.   If any questions then please take the best time and phone number to call and I will try to call him back.   Myra Rude, MD [facility] 09/27/2019, 1:44 PM

## 2019-09-27 NOTE — Patient Instructions (Signed)
Good to see you Please try ice  Please try the exercises  Please send me a message in MyChart with any questions or updates.  Please see me back in 4 weeks.   --Dr. Jovonni Borquez  

## 2019-09-27 NOTE — Progress Notes (Signed)
[name] - 59 y.o. male MRN [id number]  Date of birth: 1961-04-10  SUBJECTIVE:  Including CC & ROS.  Chief Complaint  Patient presents with  . Knee Pain    bilateral knee    [name] is a 59 y.o. male that is presenting with acute worsening of his bilateral knee pain.  Has tried hinged knee brace and medication with limited improvement.  Seems to be worse when he is on his knees or working on a regular basis.  No mechanical symptoms.   Review of Systems See HPI   HISTORY: Past Medical, Surgical, Social, and Family History Reviewed & Updated per EMR.   Pertinent Historical Findings include:  Past Medical History:  Diagnosis Date  . Kidney stone     Past Surgical History:  Procedure Laterality Date  . kidney stone removal Left 2001    No Known Allergies  No family history on file.   Social History   Socioeconomic History  . Marital status: Married    Spouse name: Not on file  . Number of children: Not on file  . Years of education: Not on file  . Highest education level: Not on file  Occupational History  . Not on file  Tobacco Use  . Smoking status: Never Smoker  . Smokeless tobacco: Never Used  Substance and Sexual Activity  . Alcohol use: No  . Drug use: No  . Sexual activity: Not on file  Other Topics Concern  . Not on file  Social History Narrative  . Not on file   Social Determinants of Health   Financial Resource Strain:   . Difficulty of Paying Living Expenses: Not on file  Food Insecurity:   . Worried About Programme researcher, broadcasting/film/video in the Last Year: Not on file  . Ran Out of Food in the Last Year: Not on file  Transportation Needs:   . Lack of Transportation (Medical): Not on file  . Lack of Transportation (Non-Medical): Not on file  Physical Activity:   . Days of Exercise per Week: Not on file  . Minutes of Exercise per Session: Not on file  Stress:   . Feeling of Stress : Not on file  Social Connections:   . Frequency of Communication  with Friends and Family: Not on file  . Frequency of Social Gatherings with Friends and Family: Not on file  . Attends Religious Services: Not on file  . Active Member of Clubs or Organizations: Not on file  . Attends Banker Meetings: Not on file  . Marital Status: Not on file  Intimate Partner Violence:   . Fear of Current or Ex-Partner: Not on file  . Emotionally Abused: Not on file  . Physically Abused: Not on file  . Sexually Abused: Not on file     PHYSICAL EXAM:  VS: Ht 6' (1.829 m)   Wt 220 lb (99.8 kg)   BMI 29.84 kg/m  Physical Exam Gen: NAD, alert, cooperative with exam, well-appearing    Aspiration/Injection Procedure Note [name] 1961-07-01  Procedure: Aspiration and Injection Indications: left knee pain   Procedure Details Consent: Risks of procedure as well as the alternatives and risks of each were explained to the (patient/caregiver).  Consent for procedure obtained. Time Out: Verified patient identification, verified procedure, site/side was marked, verified correct patient position, special equipment/implants available, medications/allergies/relevent history reviewed, required imaging and test results available.  Performed.  The area was cleaned with iodine and alcohol swabs.  The left knee superior lateral suprapatellar pouch was injected using 3 cc of 1% lidocaine without epinephrine on a 1-1/2 inch needle 25-gauge to anesthetize the skin in the tract.  An 18-gauge 1-1/2 inch needle was injected to achieve aspiration.  The syringe was switched and a mixture containing 1 cc's of 40 mg Kenalog and 4 cc's of 0.25% bupivacaine was injected.  Ultrasound was used. Images were obtained in long views showing the injection.    Amount of Fluid Aspirated: 60mL Character of Fluid: clear and straw colored Fluid was sent [id number] count and crystal identification  A sterile dressing was applied.  Patient did tolerate procedure  well.   Aspiration/Injection Procedure Note [name] Sep 22, 1960  Procedure: Aspiration and Injection Indications: Right knee pain  Procedure Details Consent: Risks of procedure as well as the alternatives and risks of each were explained to the (patient/caregiver).  Consent for procedure obtained. Time Out: Verified patient identification, verified procedure, site/side was marked, verified correct patient position, special equipment/implants available, medications/allergies/relevent history reviewed, required imaging and test results available.  Performed.  The area was cleaned with iodine and alcohol swabs.    The right knee superior lateral suprapatellar pouch was injected using 3 cc of 1% lidocaine without epinephrine on a 1-1/2 inch 25-gauge needle.  An 18-gauge 1-1/2 inch needle was used for aspiration.  The syringe was switched and a mixture containing 1 cc's of 40 mg Kenalog and 4 cc's of 0.25% bupivacaine was injected.  Ultrasound was used. Images were obtained in Long views showing the injection.    Amount of Fluid Aspirated: 34mL Character of Fluid: clear and straw colored Fluid was sent for:n/a  A sterile dressing was applied.  Patient did tolerate procedure well.     ASSESSMENT & PLAN:   Acute pain of both knees Acute worsening of his pain with bilateral effusions.  Unclear if this is related to gout or pseudogout.  No significant degenerative changes on imaging. -Bilateral aspiration and injection today. -Counseled on supportive care. -Synovial cell analysis and crystal identification for aspiration - consider uric acid

## 2019-09-28 LAB — SYNOVIAL FLUID, CELL COUNT
Eos, Fluid: 0 %
Lining Cells, Synovial: 0 %
Lymphs, Fluid: 69 %
Macrophages Fld: 27 %
Nuc cell # Fld: 460 cells/uL — ABNORMAL HIGH (ref 0–200)
Polys, Fluid: 4 %

## 2019-10-06 ENCOUNTER — Telehealth: Payer: Self-pay | Admitting: Family Medicine

## 2019-10-06 DIAGNOSIS — M25562 Pain in left knee: Secondary | ICD-10-CM

## 2019-10-06 DIAGNOSIS — M25561 Pain in right knee: Secondary | ICD-10-CM

## 2019-10-06 NOTE — Telephone Encounter (Signed)
Left VM for patient. If he calls back please have him speak with a nurse/CMA and inform him his fluid analysis didn't show gout but did show fibrin in the fluid. This may be related to an autoimmune problem so I am putting in bloodwork to be completed to check.   If any questions then please take the best time and phone number to call and I will try to call him back.   Myra Rude, MD [facility] 10/06/2019, 9:30 AM

## 2019-10-19 ENCOUNTER — Ambulatory Visit: Payer: BC Managed Care – PPO | Admitting: Family Medicine

## 2019-10-26 ENCOUNTER — Encounter: Payer: Self-pay | Admitting: Family Medicine

## 2019-10-26 ENCOUNTER — Other Ambulatory Visit: Payer: Self-pay | Admitting: Family Medicine

## 2019-10-26 ENCOUNTER — Other Ambulatory Visit: Payer: Self-pay

## 2019-10-26 ENCOUNTER — Ambulatory Visit (INDEPENDENT_AMBULATORY_CARE_PROVIDER_SITE_OTHER): Payer: Self-pay | Admitting: Family Medicine

## 2019-10-26 DIAGNOSIS — M25562 Pain in left knee: Secondary | ICD-10-CM

## 2019-10-26 DIAGNOSIS — M25561 Pain in right knee: Secondary | ICD-10-CM

## 2019-10-26 NOTE — Patient Instructions (Signed)
Good to see you Please try ice  Please continue the exercises  Please get the blood work and I'll call with the results.  We could try the gel injections if the pain returns Please send me a message in MyChart with any questions or updates.  Please see me back in 6-8 weeks.   --Dr. Jordan Likes

## 2019-10-26 NOTE — Assessment & Plan Note (Signed)
Synovial fluid analysis showed fragment clots.  No crystals were seen.  Possible for underlying rheumatologic condition. -Obtain uric acid and ANA panel. -Discussed gel injections. -Counseled on home exercise therapy and supportive care. -Could consider physical therapy.

## 2019-10-26 NOTE — Progress Notes (Signed)
  [name] - 59 y.o. male MRN [id number]  Date of birth: 10/04/1960  SUBJECTIVE:  Including CC & ROS.  No chief complaint on file.   [name] is a 59 y.o. male that is following up for his bilateral knee pain.  He has had improvement of the knee pain.  He does experience it laterally from time to time.  He still has intermittent swelling.  Denies any mechanical symptoms.  Review of his synovial fluid analysis showed an increased nuclear cell number with fibrin clots.  No crystals were observed   Review of Systems See HPI   HISTORY: Past Medical, Surgical, Social, and Family History Reviewed & Updated per EMR.   Pertinent Historical Findings include:  Past Medical History:  Diagnosis Date  . Kidney stone     Past Surgical History:  Procedure Laterality Date  . kidney stone removal Left 2001    No family history on file.  Social History   Socioeconomic History  . Marital status: Married    Spouse name: Not on file  . Number of children: Not on file  . Years of education: Not on file  . Highest education level: Not on file  Occupational History  . Not on file  Tobacco Use  . Smoking status: Never Smoker  . Smokeless tobacco: Never Used  Substance and Sexual Activity  . Alcohol use: No  . Drug use: No  . Sexual activity: Not on file  Other Topics Concern  . Not on file  Social History Narrative  . Not on file   Social Determinants of Health   Financial Resource Strain:   . Difficulty of Paying Living Expenses: Not on file  Food Insecurity:   . Worried About Programme researcher, broadcasting/film/video in the Last Year: Not on file  . Ran Out of Food in the Last Year: Not on file  Transportation Needs:   . Lack of Transportation (Medical): Not on file  . Lack of Transportation (Non-Medical): Not on file  Physical Activity:   . Days of Exercise per Week: Not on file  . Minutes of Exercise per Session: Not on file  Stress:   . Feeling of Stress : Not on file  Social Connections:    . Frequency of Communication with Friends and Family: Not on file  . Frequency of Social Gatherings with Friends and Family: Not on file  . Attends Religious Services: Not on file  . Active Member of Clubs or Organizations: Not on file  . Attends Banker Meetings: Not on file  . Marital Status: Not on file  Intimate Partner Violence:   . Fear of Current or Ex-Partner: Not on file  . Emotionally Abused: Not on file  . Physically Abused: Not on file  . Sexually Abused: Not on file     PHYSICAL EXAM:  VS: There were no vitals taken for this visit. Physical Exam Gen: NAD, alert, cooperative with exam, well-appearing MSK:  Right and left knee: Mild effusion of the right knee. Normal range of motion. No tenderness to palpation over the medial or lateral joint line. Neurovascularly intact     ASSESSMENT & PLAN:   Acute pain of both knees Synovial fluid analysis showed fragment clots.  No crystals were seen.  Possible for underlying rheumatologic condition. -Obtain uric acid and ANA panel. -Discussed gel injections. -Counseled on home exercise therapy and supportive care. -Could consider physical therapy.

## 2019-10-28 LAB — ANA,IFA RA DIAG PNL W/RFLX TIT/PATN
ANA Titer 1: NEGATIVE
Cyclic Citrullin Peptide Ab: 12 units (ref 0–19)
Rheumatoid fact SerPl-aCnc: 14.1 IU/mL — ABNORMAL HIGH (ref 0.0–13.9)

## 2019-10-28 LAB — URIC ACID: Uric Acid: 6.2 mg/dL (ref 3.8–8.4)

## 2019-11-03 ENCOUNTER — Telehealth: Payer: Self-pay | Admitting: Family Medicine

## 2019-11-03 NOTE — Telephone Encounter (Signed)
Left VM for patient. If he calls back please have him speak with a nurse/CMA and inform that his uric acid and rheumatoid factor mildly elevated.  It may be worth starting a more chronic medication for the gout to see if that improves his symptoms in his knees.  This is called allopurinol.  If he wants to do this I can send the medicine in but then would also start short course of prednisone with it.   If any questions then please take the best time and phone number to call and I will try to call him back.   Myra Rude, MD [facility] 11/03/2019, 9:15 AM

## 2019-11-04 ENCOUNTER — Telehealth: Payer: Self-pay | Admitting: Family Medicine

## 2019-11-04 DIAGNOSIS — G8929 Other chronic pain: Secondary | ICD-10-CM

## 2019-11-04 DIAGNOSIS — R768 Other specified abnormal immunological findings in serum: Secondary | ICD-10-CM

## 2019-11-04 DIAGNOSIS — M25561 Pain in right knee: Secondary | ICD-10-CM

## 2019-11-04 DIAGNOSIS — M25562 Pain in left knee: Secondary | ICD-10-CM

## 2019-11-04 NOTE — Telephone Encounter (Signed)
Left VM for patient. If he calls back please have him speak with a nurse/CMA and inform that I have put in xrays. He can have these done at any point. I also put in a referral to rheumatology to see if they have anything to add for his knees. We could consider gel injections if his pain is returning.   If any questions then please take the best time and phone number to call and I will try to call him back.   Myra Rude, MD [facility] 11/04/2019, 2:43 PM

## 2019-11-04 NOTE — Telephone Encounter (Signed)
Patient called requesting advice. He is still experiencing pain in his knees especially when going down stairs. He also said it looks like fluid is building back up in one of his knees.

## 2019-11-04 NOTE — Telephone Encounter (Signed)
Patient returned call regarding advice on knee pain. I informed patient that xray orders have been put in. He states he will get these done on his day off of work and he will call to let us know once xrays have been completed.   Informed patient the rheumatologist will be contacting him to set up an appointment and if pain continues we can try gel injections.

## 2019-11-08 ENCOUNTER — Telehealth: Payer: Self-pay | Admitting: Family Medicine

## 2019-11-08 ENCOUNTER — Ambulatory Visit (HOSPITAL_BASED_OUTPATIENT_CLINIC_OR_DEPARTMENT_OTHER)
Admission: RE | Admit: 2019-11-08 | Discharge: 2019-11-08 | Disposition: A | Discharge status: Home / Self Care | Payer: Self-pay | Source: Ambulatory Visit | Attending: Family Medicine | Admitting: Family Medicine

## 2019-11-08 ENCOUNTER — Other Ambulatory Visit: Payer: Self-pay

## 2019-11-08 DIAGNOSIS — M25561 Pain in right knee: Secondary | ICD-10-CM | POA: Insufficient documentation

## 2019-11-08 DIAGNOSIS — M25562 Pain in left knee: Secondary | ICD-10-CM | POA: Insufficient documentation

## 2019-11-08 MED ORDER — IBUPROFEN 600 MG PO TABS: 600.0000 mg | ORAL_TABLET | Freq: Three times a day (TID) | ORAL | 1 refills | Status: DC | PRN

## 2019-11-08 MED ORDER — PREDNISONE 5 MG PO TABS: ORAL_TABLET | ORAL | 0 refills | Status: DC

## 2019-11-08 NOTE — Telephone Encounter (Signed)
Patient requesting refill of ibuprofen (ADVIL) 800 MG tablet   Pharmacy: [facility] [address]

## 2019-11-08 NOTE — Telephone Encounter (Signed)
Spoke with patient. Will try prednisone and can use ibuprofen after if needed.   Myra Rude, MD [facility] 11/08/2019, 9:27 AM

## 2019-11-09 ENCOUNTER — Telehealth: Payer: Self-pay | Admitting: Family Medicine

## 2019-11-09 NOTE — Telephone Encounter (Signed)
Left VM for patient. If he calls back please have him speak with a nurse/CMA and inform that his xrays show minimal degenerative changes.   If any questions then please take the best time and phone number to call and I will try to call him back.   Myra Rude, MD [facility] 11/09/2019, 9:31 AM

## 2019-11-09 NOTE — Telephone Encounter (Signed)
Patient returning call for xray results  

## 2019-11-09 NOTE — Telephone Encounter (Signed)
Spoke to patient and gave him result information as provided by the physician. 

## 2019-11-29 ENCOUNTER — Ambulatory Visit (HOSPITAL_BASED_OUTPATIENT_CLINIC_OR_DEPARTMENT_OTHER)
Admission: RE | Admit: 2019-11-29 | Discharge: 2019-11-29 | Disposition: A | Discharge status: Home / Self Care | Payer: Self-pay | Source: Ambulatory Visit | Attending: Family Medicine | Admitting: Family Medicine

## 2019-11-29 ENCOUNTER — Encounter: Payer: Self-pay | Admitting: Family Medicine

## 2019-11-29 ENCOUNTER — Ambulatory Visit (INDEPENDENT_AMBULATORY_CARE_PROVIDER_SITE_OTHER): Payer: Self-pay | Admitting: Family Medicine

## 2019-11-29 ENCOUNTER — Other Ambulatory Visit: Payer: Self-pay

## 2019-11-29 ENCOUNTER — Telehealth: Payer: Self-pay | Admitting: Family Medicine

## 2019-11-29 VITALS — BP 142/95 | HR 72 | Ht 72.0 in | Wt 235.0 lb

## 2019-11-29 DIAGNOSIS — M7989 Other specified soft tissue disorders: Secondary | ICD-10-CM | POA: Insufficient documentation

## 2019-11-29 DIAGNOSIS — M25462 Effusion, left knee: Secondary | ICD-10-CM

## 2019-11-29 DIAGNOSIS — M25461 Effusion, right knee: Secondary | ICD-10-CM

## 2019-11-29 MED ORDER — NAPROXEN 500 MG PO TABS: 500.0000 mg | ORAL_TABLET | Freq: Two times a day (BID) | ORAL | 1 refills | Status: DC

## 2019-11-29 NOTE — Assessment & Plan Note (Signed)
Unilateral swelling of the left leg.  No history of blood clot. -Venous duplex. -Counseled on supportive care.

## 2019-11-29 NOTE — Assessment & Plan Note (Signed)
Is having ongoing pain and recurrent effusions.  His uric acid was at 6.2 he had a mildly elevated rheumatoid at 14.1.  Seems more inflammatory as imaging does not demonstrate significant arthritic change.  May be a component of degenerative meniscal tear. -Stop ibuprofen and initiate naproxen. -Counseled on home exercise therapy and supportive care. -Could consider Toradol injection if pain is still severe. -Could consider physical therapy or MRI

## 2019-11-29 NOTE — Telephone Encounter (Signed)
Informed of result.   Myra Rude, MD [facility] 11/29/2019, 4:42 PM

## 2019-11-29 NOTE — Patient Instructions (Signed)
Good to see you Please try the exercises  Please stop the ibuprofen and start the naproxen  I will talk to you about duplex findings  Please send me a message in MyChart with any questions or updates.  Please see me back in 4 weeks.   --Dr. Jordan Likes

## 2019-11-29 NOTE — Progress Notes (Signed)
[name] - 59 y.o. male MRN [id number]  Date of birth: 09-Jul-1961  SUBJECTIVE:  Including CC & ROS.  Chief Complaint  Patient presents with  . Follow-up    follow up for bilateral knee    [name] is a 59 y.o. male that is presenting with ongoing bilateral knee pain.  The left knee is worse than the right.  He has had injections about 2 months ago.  Injections helped for a period of time.  He feels the pain over the anterior aspect of the knee.  He has tried braces before.  He has had limited improvement therapy program.  He is also having unilateral swelling of the left leg.  Most of his pain seems to be more anterior in nature.  Is worse with going up and down stairs or ladders.  Feels like it may be giving way from time to time.   Review of Systems See HPI   HISTORY: Past Medical, Surgical, Social, and Family History Reviewed & Updated per EMR.   Pertinent Historical Findings include:  Past Medical History:  Diagnosis Date  . Kidney stone     Past Surgical History:  Procedure Laterality Date  . kidney stone removal Left 2001    No family history on file.  Social History   Socioeconomic History  . Marital status: Married    Spouse name: Not on file  . Number of children: Not on file  . Years of education: Not on file  . Highest education level: Not on file  Occupational History  . Not on file  Tobacco Use  . Smoking status: Never Smoker  . Smokeless tobacco: Never Used  Substance and Sexual Activity  . Alcohol use: No  . Drug use: No  . Sexual activity: Not on file  Other Topics Concern  . Not on file  Social History Narrative  . Not on file   Social Determinants of Health   Financial Resource Strain:   . Difficulty of Paying Living Expenses:   Food Insecurity:   . Worried About Charity fundraiser in the Last Year:   . Arboriculturist in the Last Year:   Transportation Needs:   . Film/video editor (Medical):   Marland Kitchen Lack of Transportation  (Non-Medical):   Physical Activity:   . Days of Exercise per Week:   . Minutes of Exercise per Session:   Stress:   . Feeling of Stress :   Social Connections:   . Frequency of Communication with Friends and Family:   . Frequency of Social Gatherings with Friends and Family:   . Attends Religious Services:   . Active Member of Clubs or Organizations:   . Attends Archivist Meetings:   Marland Kitchen Marital Status:   Intimate Partner Violence:   . Fear of Current or Ex-Partner:   . Emotionally Abused:   Marland Kitchen Physically Abused:   . Sexually Abused:      PHYSICAL EXAM:  VS: BP (!) 142/95   Pulse 72   Ht 6' (1.829 m)   Wt 235 lb (106.6 kg)   BMI 31.87 kg/m  Physical Exam Gen: NAD, alert, cooperative with exam, well-appearing MSK:  Left knee:  Effusion noted. Normal range of motion. No instability. Pitting edema distally. No tenderness of the gastrocnemius. Neurovascularly intact     ASSESSMENT & PLAN:   Leg swelling Unilateral swelling of the left leg.  No history of blood clot. -Venous duplex. -Counseled on supportive care.  Bilateral knee effusions Is having ongoing pain and recurrent effusions.  His uric acid was at 6.2 he had a mildly elevated rheumatoid at 14.1.  Seems more inflammatory as imaging does not demonstrate significant arthritic change.  May be a component of degenerative meniscal tear. -Stop ibuprofen and initiate naproxen. -Counseled on home exercise therapy and supportive care. -Could consider Toradol injection if pain is still severe. -Could consider physical therapy or MRI

## 2019-12-20 ENCOUNTER — Ambulatory Visit (INDEPENDENT_AMBULATORY_CARE_PROVIDER_SITE_OTHER): Payer: Self-pay | Admitting: Family Medicine

## 2019-12-20 ENCOUNTER — Other Ambulatory Visit: Payer: Self-pay

## 2019-12-20 ENCOUNTER — Encounter: Payer: Self-pay | Admitting: Family Medicine

## 2019-12-20 VITALS — BP 142/95 | Temp 87.0°F | Ht 75.0 in | Wt 230.0 lb

## 2019-12-20 DIAGNOSIS — M25462 Effusion, left knee: Secondary | ICD-10-CM

## 2019-12-20 DIAGNOSIS — M25461 Effusion, right knee: Secondary | ICD-10-CM

## 2019-12-20 NOTE — Progress Notes (Signed)
Medication Samples have been provided to the patient.  Drug name: Rayos       Strength: 5mg         Qty: 2 Boxes  LOT: B  Exp.Date: 07/2020  Dosing instructions: Take 2 tablets by mouth at bedtime.  The patient has been instructed regarding the correct time, dose, and frequency of taking this medication, including desired effects and most common side effects.   08/2020, Kathi Simpers 12:58 PM 12/20/2019

## 2019-12-20 NOTE — Assessment & Plan Note (Addendum)
Effusions are reoccurring.  We have tried oral medications as well as steroid injections.  Has had limited improvement thus far.  May be inflammatory but also seems to have mechanical symptoms going up and down stairs. -Counseled on home exercise therapy and supportive care. -Provided samples of Rayos. -MRI of the left knee to evaluate for inflammatory component or meniscus damage -Could consider Toradol or saline injection.

## 2019-12-20 NOTE — Progress Notes (Signed)
[name] - 59 y.o. male MRN [id number]  Date of birth: [birth year]  SUBJECTIVE:  Including CC & ROS.  Chief Complaint  Patient presents with  . Follow-up    follow up for bilateral knee    [name] is a 59 y.o. male that is presenting with acute worsening of his bilateral knee pain.  We have tried prednisone and steroid injections.  He did have an elevated rheumatoid factor.  Pain seems to be worse with going up and down stairs.  Effusions are reoccurring.   Review of Systems See HPI   HISTORY: Past Medical, Surgical, Social, and Family History Reviewed & Updated per EMR.   Pertinent Historical Findings include:  Past Medical History:  Diagnosis Date  . Kidney stone     Past Surgical History:  Procedure Laterality Date  . kidney stone removal Left 2001    No family history on file.  Social History   Socioeconomic History  . Marital status: Married    Spouse name: Not on file  . Number of children: Not on file  . Years of education: Not on file  . Highest education level: Not on file  Occupational History  . Not on file  Tobacco Use  . Smoking status: Never Smoker  . Smokeless tobacco: Never Used  Substance and Sexual Activity  . Alcohol use: No  . Drug use: No  . Sexual activity: Not on file  Other Topics Concern  . Not on file  Social History Narrative  . Not on file   Social Determinants of Health   Financial Resource Strain:   . Difficulty of Paying Living Expenses:   Food Insecurity:   . Worried About Programme researcher, broadcasting/film/video in the Last Year:   . Barista in the Last Year:   Transportation Needs:   . Freight forwarder (Medical):   Marland Kitchen Lack of Transportation (Non-Medical):   Physical Activity:   . Days of Exercise per Week:   . Minutes of Exercise per Session:   Stress:   . Feeling of Stress :   Social Connections:   . Frequency of Communication with Friends and Family:   . Frequency of Social Gatherings with Friends and Family:   .  Attends Religious Services:   . Active Member of Clubs or Organizations:   . Attends Banker Meetings:   Marland Kitchen Marital Status:   Intimate Partner Violence:   . Fear of Current or Ex-Partner:   . Emotionally Abused:   Marland Kitchen Physically Abused:   . Sexually Abused:      PHYSICAL EXAM:  VS: BP (!) 142/95   Temp (!) 87 F (30.6 C)   Ht 6\' 3"  (1.905 m)   Wt 230 lb (104.3 kg)   BMI 28.75 kg/m  Physical Exam Gen: NAD, alert, cooperative with exam, well-appearing MSK:  Right and left knee: Left knee with worse effusion on the right. Normal range of motion. Positive McMurray's test. No instability valgus varus stress testing. Neurovascularly intact     ASSESSMENT & PLAN:   Bilateral knee effusions Effusions are reoccurring.  We have tried oral medications as well as steroid injections.  Has had limited improvement thus far.  May be inflammatory but also seems to have mechanical symptoms going up and down stairs. -Counseled on home exercise therapy and supportive care. -Provided samples of Rayos. -MRI of the left knee to evaluate for inflammatory component or meniscus damage -Could consider Toradol or saline injection.

## 2019-12-20 NOTE — Patient Instructions (Addendum)
Good to see you Please try the rayos  Please try ice   Please call [facility] imaging in a couple of days to set up your MRI. [phone] Please send me a message in MyChart with any questions or updates.  We will set up a virtual visit once the MRI is resulted.   --Dr. Jordan Likes

## 2019-12-21 ENCOUNTER — Ambulatory Visit: Payer: Self-pay | Admitting: Family Medicine

## 2019-12-27 ENCOUNTER — Ambulatory Visit: Payer: Self-pay | Admitting: Family Medicine

## 2020-02-21 ENCOUNTER — Other Ambulatory Visit: Payer: BC Managed Care – PPO

## 2020-02-21 ENCOUNTER — Other Ambulatory Visit: Payer: Self-pay | Admitting: Family Medicine

## 2020-02-23 ENCOUNTER — Encounter (HOSPITAL_COMMUNITY): Payer: Self-pay | Admitting: Emergency Medicine

## 2020-02-23 ENCOUNTER — Emergency Department (HOSPITAL_COMMUNITY)
Admission: EM | Admit: 2020-02-23 | Discharge: 2020-02-23 | Disposition: A | Discharge status: Home / Self Care | Payer: BC Managed Care – PPO | Attending: Emergency Medicine | Admitting: Emergency Medicine

## 2020-02-23 ENCOUNTER — Other Ambulatory Visit: Payer: Self-pay

## 2020-02-23 DIAGNOSIS — M25562 Pain in left knee: Secondary | ICD-10-CM | POA: Insufficient documentation

## 2020-02-23 DIAGNOSIS — M25561 Pain in right knee: Secondary | ICD-10-CM | POA: Insufficient documentation

## 2020-02-23 NOTE — ED Triage Notes (Signed)
Pt reports ongoing bilateral knee pain that he has seen sports medicine for. Reports he has been on multiple medications without much relief. Pt states "I feel like they are giving me the run around because I have been up there 3 times without any relief." denies any specific injury, but states that he is now going up on ladders more at work so the pain has increased.

## 2020-02-24 ENCOUNTER — Other Ambulatory Visit: Payer: Self-pay

## 2020-02-24 ENCOUNTER — Emergency Department (HOSPITAL_COMMUNITY)
Admission: EM | Admit: 2020-02-24 | Discharge: 2020-02-24 | Disposition: A | Discharge status: Home / Self Care | Payer: BC Managed Care – PPO | Attending: Emergency Medicine | Admitting: Emergency Medicine

## 2020-02-24 DIAGNOSIS — G8929 Other chronic pain: Secondary | ICD-10-CM | POA: Insufficient documentation

## 2020-02-24 DIAGNOSIS — M25562 Pain in left knee: Secondary | ICD-10-CM | POA: Diagnosis not present

## 2020-02-24 DIAGNOSIS — M25561 Pain in right knee: Secondary | ICD-10-CM | POA: Diagnosis present

## 2020-02-24 MED ORDER — LIDOCAINE 5 % EX PTCH
2.0000 | MEDICATED_PATCH | CUTANEOUS | Status: DC
  Administered 2020-02-24: 2 via TRANSDERMAL
  Filled 2020-02-24: qty 2

## 2020-02-24 MED ORDER — DICLOFENAC SODIUM 1 % EX GEL: 4.0000 g | Freq: Four times a day (QID) | CUTANEOUS | 0 refills | Status: DC

## 2020-02-24 MED ORDER — LIDOCAINE 5 % EX PTCH: 1.0000 | MEDICATED_PATCH | CUTANEOUS | 0 refills | Status: DC

## 2020-02-24 MED ORDER — KETOROLAC TROMETHAMINE 15 MG/ML IJ SOLN
15.0000 mg | Freq: Once | INTRAMUSCULAR | Status: AC
  Administered 2020-02-24: 15 mg via INTRAMUSCULAR
  Filled 2020-02-24: qty 1

## 2020-02-24 MED ORDER — MELOXICAM 15 MG PO TABS: 15.0000 mg | ORAL_TABLET | Freq: Every day | ORAL | 0 refills | Status: DC

## 2020-02-24 NOTE — ED Triage Notes (Signed)
Pt has chronic knee pains for years. Reports that seen a doctor in [place] and been getting the run around bc will get medications but won't help. 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

## 2020-02-24 NOTE — Discharge Instructions (Addendum)
Use the medications as prescribed.  Follow-up outpatient with orthopedic surgery.  Return for any worsening symptoms.

## 2020-02-24 NOTE — ED Provider Notes (Signed)
[facility]-EMERGENCY DEPT Provider Note   CSN: [id number] Arrival date & time: 02/24/20  1048     History Chief Complaint  Patient presents with  . Knee Pain    [name] is a 59 y.o. male with past medical history significant for chronic knee pain, chronic bilateral knee effusions who presents for evaluation of knee pain.  Patient states he has had bilateral knee pain for years.  Was previously being followed with sports medicine in [facility].  Patient has been seen multiple times is been prescribed multiple medications as well as steroid injections.  Patient states he is continuing to have symptoms.  Was previously recommended to have MRI of his knees however states he was uninsured at that time and could not afford it.  Patient states he is now insured however continues to have pain.  Pain is worse with going up and down stairs.  Patient states he does bending due to work.  No recent injury or trauma.  Denies additional aggravating or alleviating factors.  He denies headache, lightness, dizziness, chest pain, shortness of breath, lateral leg swelling, redness, warmth, edema, erythema to bilateral knees.  Denies aggravating or alleviating factors.  Pain is rated a 6/10.  Described as aching.  History obtained from patient and past medical records. No interpretor was used.  HPI     Past Medical History:  Diagnosis Date  . Kidney stone     Patient Active Problem List   Diagnosis Date Noted  . Leg swelling 11/29/2019  . Bilateral knee effusions 09/21/2019  . Left knee pain 12/05/2016    Past Surgical History:  Procedure Laterality Date  . kidney stone removal Left 2001       No family history on file.  Social History   Tobacco Use  . Smoking status: Never Smoker  . Smokeless tobacco: Never Used  Substance Use Topics  . Alcohol use: No  . Drug use: No    Home Medications Prior to Admission medications   Medication Sig Start Date End Date  Taking? Authorizing Provider  diclofenac Sodium (VOLTAREN) 1 % GEL Apply 4 g topically 4 (four) times daily. 02/24/20   Dontray Haberland A, PA-C  ibuprofen (ADVIL) 600 MG tablet Take 1 tablet (600 mg total) by mouth every 8 (eight) hours as needed. 11/08/19   Myra Rude, MD  lidocaine (LIDODERM) 5 % Place 1 patch onto the skin daily. Remove & Discard patch within 12 hours or as directed by MD 02/24/20   Pamlea Finder A, PA-C  meloxicam (MOBIC) 15 MG tablet Take 1 tablet (15 mg total) by mouth daily. 02/24/20   Ahley Bulls A, PA-C  naproxen (NAPROSYN) 500 MG tablet Take 1 tablet (500 mg total) by mouth 2 (two) times daily with a meal. 11/29/19 11/28/20  Myra Rude, MD  predniSONE (DELTASONE) 5 MG tablet Take 6 pills for first day, 5 pills second day, 4 pills third day, 3 pills fourth day, 2 pills the fifth day, and 1 pill sixth day. 11/08/19   Myra Rude, MD    Allergies    Patient has no known allergies.  Review of Systems   Review of Systems  Constitutional: Negative.   HENT: Negative.   Respiratory: Negative.   Cardiovascular: Negative.   Gastrointestinal: Negative.   Genitourinary: Negative.   Musculoskeletal: Negative.        Bilateral knee pain  Skin: Negative.   Neurological: Negative.   All other systems reviewed and are  negative.  Physical Exam Updated Vital Signs BP (!) 140/101 (BP Location: Right Arm)   Pulse 77   Temp 98 F (36.7 C) (Oral)   Resp 16   SpO2 97%   Physical Exam Vitals and nursing note reviewed.  Constitutional:      General: He is not in acute distress.    Appearance: He is well-developed. He is not diaphoretic.  HENT:     Head: Atraumatic.  Eyes:     Pupils: Pupils are equal, round, and reactive to light.  Cardiovascular:     Rate and Rhythm: Normal rate and regular rhythm.     Pulses: Normal pulses.          Dorsalis pedis pulses are 2+ on the right side and 2+ on the left side.     Heart sounds: Normal heart sounds.    Pulmonary:     Effort: Pulmonary effort is normal. No respiratory distress.     Breath sounds: Normal breath sounds.  Abdominal:     General: Bowel sounds are normal. There is no distension.     Palpations: Abdomen is soft.     Tenderness: There is no abdominal tenderness. There is no guarding.  Musculoskeletal:        General: Normal range of motion.     Cervical back: Normal range of motion and neck supple.     Right upper leg: Normal.     Left upper leg: Normal.     Right knee: Crepitus present. No swelling, deformity, effusion, erythema, ecchymosis, lacerations or bony tenderness. Normal range of motion. No tenderness.     Left knee: Crepitus present. No swelling, deformity, effusion, erythema, ecchymosis, lacerations or bony tenderness. Normal range of motion. No tenderness.     Right lower leg: Normal.     Left lower leg: Normal.     Right ankle: Normal.     Left ankle: Normal.     Comments: Able to flex and extend at bilateral knees.  Crepitus with bilateral flexion and extension to bilateral knees.  No large joint effusion.  No edema, erythema or warmth.  Compartment soft.  No bony tenderness.  Feet:     Right foot:     Skin integrity: Skin integrity normal.     Left foot:     Skin integrity: Skin integrity normal.  Skin:    General: Skin is warm and dry.     Capillary Refill: Capillary refill takes less than 2 seconds.     Comments: No edema, erythema or warmth.  No fluctuance or induration.  Neurological:     Mental Status: He is alert.     Motor: Motor function is intact.     Coordination: Coordination is intact.     Gait: Gait is intact.    ED Results / Procedures / Treatments   Labs (all labs ordered are listed, but only abnormal results are displayed) Labs Reviewed - No data to display  EKG None  Radiology No results found.  Procedures Procedures (including critical care time)  Medications Ordered in ED Medications  lidocaine (LIDODERM) 5 % 2 patch  (2 patches Transdermal Patch Applied 02/24/20 1220)  ketorolac (TORADOL) 15 MG/ML injection 15 mg (15 mg Intramuscular Given 02/24/20 1220)    ED Course  I have reviewed the triage vital signs and the nursing notes.  Pertinent labs & imaging results that were available during my care of the patient were reviewed by me and considered in my medical decision making (see  chart for details).  59 year old presents for evaluation of bilateral chronic knee pain.  Has had knee pain for years.  Pain is chronic in nature.  No recent injury or trauma.  He does have some crepitus with flexion/extension with seems consistent with his prior Ortho notes.  Unfortunately has tried multiple outpatient oral medication treatments as well as steroid injections without relief.  Patient requesting Ortho surgery referral.  Do not feel he needs additional imaging at this time.  Symptoms likely acute on chronic.  Low suspicion for septic joint, gout, hemarthrosis, myositis, vascular occlusion, fracture or dislocation.  Patient states Mobic has worked best have all his other medications were does not fully take the pain away.  Will restart on mobic, lidocaine patches and referral outpatient.  The patient has been appropriately medically screened and/or stabilized in the ED. I have low suspicion for any other emergent medical condition which would require further screening, evaluation or treatment in the ED or require inpatient management.  Patient is hemodynamically stable and in no acute distress.  Patient able to ambulate in department prior to ED.  Evaluation does not show acute pathology that would require ongoing or additional emergent interventions while in the emergency department or further inpatient treatment.  I have discussed the diagnosis with the patient and answered all questions.  Pain is been managed while in the emergency department and patient has no further complaints prior to discharge.  Patient is  comfortable with plan discussed in room and is stable for discharge at this time.  I have discussed strict return precautions for returning to the emergency department.  Patient was encouraged to follow-up with PCP/specialist refer to at discharge.     MDM Rules/Calculators/A&P                           Final Clinical Impression(s) / ED Diagnoses Final diagnoses:  Chronic pain of both knees    Rx / DC Orders ED Discharge Orders         Ordered    meloxicam (MOBIC) 15 MG tablet  Daily     Discontinue  Reprint     02/24/20 1249    diclofenac Sodium (VOLTAREN) 1 % GEL  4 times daily     Discontinue  Reprint     02/24/20 1249    lidocaine (LIDODERM) 5 %  Every 24 hours     Discontinue  Reprint     02/24/20 1249           Artemis Loyal A, PA-C 02/24/20 1307    Little, Ambrose Finland, MD 02/24/20 1308

## 2020-06-15 ENCOUNTER — Encounter (HOSPITAL_BASED_OUTPATIENT_CLINIC_OR_DEPARTMENT_OTHER): Payer: Self-pay | Admitting: *Deleted

## 2020-06-15 ENCOUNTER — Other Ambulatory Visit: Payer: Self-pay

## 2020-06-15 ENCOUNTER — Emergency Department (HOSPITAL_BASED_OUTPATIENT_CLINIC_OR_DEPARTMENT_OTHER)
Admission: EM | Admit: 2020-06-15 | Discharge: 2020-06-16 | Disposition: A | Discharge status: Home / Self Care | Payer: BC Managed Care – PPO | Attending: Emergency Medicine | Admitting: Emergency Medicine

## 2020-06-15 DIAGNOSIS — H1032 Unspecified acute conjunctivitis, left eye: Secondary | ICD-10-CM

## 2020-06-15 DIAGNOSIS — H5789 Other specified disorders of eye and adnexa: Secondary | ICD-10-CM | POA: Diagnosis present

## 2020-06-15 MED ORDER — FLUORESCEIN SODIUM 1 MG OP STRP
ORAL_STRIP | OPHTHALMIC | Status: AC
  Filled 2020-06-15: qty 1

## 2020-06-15 MED ORDER — TETRACAINE HCL 0.5 % OP SOLN
OPHTHALMIC | Status: AC
  Filled 2020-06-15: qty 4

## 2020-06-15 NOTE — ED Triage Notes (Signed)
States he feels he has "pink eye". Swelling, draining and itching x 3 days.

## 2020-06-15 NOTE — ED Provider Notes (Signed)
   [facility] Provider Note: Lowella Dell, MD, FACEP  CSN: [id number] MRN: [id number] ARRIVAL: 06/15/20 at 2235 ROOM: [facility]08/[facility]08   CHIEF COMPLAINT  Eye Irritation   HISTORY OF PRESENT ILLNESS  06/15/20 11:53 PM [name] is a 59 y.o. male with swelling, itching and drainage from the left eye for the past 3 days.  He rates associated discomfort as a 1 out of 10.  He denies any significant visual change in the left eye.  He is not aware of injuring the left eye.   Past Medical History:  Diagnosis Date  . Kidney stone     Past Surgical History:  Procedure Laterality Date  . kidney stone removal Left 2001    No family history on file.  Social History   Tobacco Use  . Smoking status: Never Smoker  . Smokeless tobacco: Never Used  Substance Use Topics  . Alcohol use: No  . Drug use: No    Prior to Admission medications   Not on File    Allergies Patient has no known allergies.   REVIEW OF SYSTEMS  Negative except as noted here or in the History of Present Illness.   PHYSICAL EXAMINATION  Initial Vital Signs Blood pressure 125/89, pulse 61, temperature 97.8 F (36.6 C), temperature source Oral, resp. rate 16, height 6' (1.829 m), weight 98.8 kg, SpO2 98 %.  Examination General: Well-developed, well-nourished male in no acute distress; appearance consistent with age of record HENT: normocephalic; atraumatic Eyes: Right pupil coloboma; left pupil round and reactive to light; extraocular muscles intact; arcus senilis bilaterally; left conjunctival exudate without significant erythema; negative fluorescein exam on the left Neck: supple Heart: regular rate and rhythm Lungs: clear to auscultation bilaterally Abdomen: soft; nondistended Extremities: No deformity; full range of motion Neurologic: Awake, alert and oriented; motor function intact in all extremities and symmetric; no facial droop Skin: Warm and dry Psychiatric: Normal mood and  affect   RESULTS  Summary of this visit's results, reviewed and interpreted by myself:   EKG Interpretation  Date/Time:    Ventricular Rate:    PR Interval:    QRS Duration:   QT Interval:    QTC Calculation:   R Axis:     Text Interpretation:        Laboratory Studies: No results found for this or any previous visit (from the past 24 hour(s)). Imaging Studies: No results found.  ED COURSE and MDM  Nursing notes, initial and subsequent vitals signs, including pulse oximetry, reviewed and interpreted by myself.  Vitals:   06/15/20 2250 06/15/20 2253  BP:  125/89  Pulse:  61  Resp:  16  Temp:  97.8 F (36.6 C)  TempSrc:  Oral  SpO2:  98%  Weight: 98.8 kg   Height: 6' (1.829 m)    Medications  tetracaine (PONTOCAINE) 0.5 % ophthalmic solution (has no administration in time range)  fluorescein 1 MG ophthalmic strip (has no administration in time range)  ofloxacin (OCUFLOX) 0.3 % ophthalmic solution 1 drop (has no administration in time range)    Presentation consistent with mild conjunctivitis.  We will place him on antibiotic drops and refer to ophthalmology if symptoms or not improving.  PROCEDURES  Procedures   ED DIAGNOSES     ICD-10-CM   1. Acute conjunctivitis of left eye, unspecified acute conjunctivitis type  H10.32        Barnaby Rippeon, Jonny Ruiz, MD 06/16/20 0008

## 2020-06-16 MED ORDER — OFLOXACIN 0.3 % OP SOLN
1.0000 [drp] | OPHTHALMIC | Status: DC
  Administered 2020-06-16: 1 [drp] via OPHTHALMIC
  Filled 2020-06-16: qty 5

## 2020-08-06 ENCOUNTER — Emergency Department (HOSPITAL_BASED_OUTPATIENT_CLINIC_OR_DEPARTMENT_OTHER)
Admission: EM | Admit: 2020-08-06 | Discharge: 2020-08-06 | Disposition: A | Discharge status: Home / Self Care | Payer: BC Managed Care – PPO | Attending: Emergency Medicine | Admitting: Emergency Medicine

## 2020-08-06 ENCOUNTER — Encounter (HOSPITAL_BASED_OUTPATIENT_CLINIC_OR_DEPARTMENT_OTHER): Payer: Self-pay | Admitting: *Deleted

## 2020-08-06 ENCOUNTER — Other Ambulatory Visit: Payer: Self-pay | Admitting: Nurse Practitioner

## 2020-08-06 ENCOUNTER — Other Ambulatory Visit: Payer: Self-pay

## 2020-08-06 DIAGNOSIS — R059 Cough, unspecified: Secondary | ICD-10-CM | POA: Diagnosis present

## 2020-08-06 DIAGNOSIS — U071 COVID-19: Secondary | ICD-10-CM

## 2020-08-06 LAB — RESP PANEL BY RT-PCR (FLU A&B, COVID) ARPGX2
Influenza A by PCR: NEGATIVE
Influenza B by PCR: NEGATIVE
SARS Coronavirus 2 by RT PCR: POSITIVE — AB

## 2020-08-06 NOTE — ED Provider Notes (Signed)
[facility] EMERGENCY DEPARTMENT Provider Note   CSN: [id number] Arrival date & time: 08/06/20  0555     History Chief Complaint  Patient presents with  . [name] is a 59 y.o. male.  The history is provided by the patient.  Cough Severity:  Moderate Onset quality:  Gradual Duration:  2 days Timing:  Intermittent Progression:  Unchanged Chronicity:  New Relieved by:  Nothing Worsened by:  Nothing Associated symptoms: chills, fever, myalgias, shortness of breath and sinus congestion    Patient reports cough, chills, fever.  He had a whole Covid test that was positive.  He is not vaccinated for Covid.     Past Medical History:  Diagnosis Date  . Kidney stone     Patient Active Problem List   Diagnosis Date Noted  . Leg swelling 11/29/2019  . Bilateral knee effusions 09/21/2019  . Left knee pain 12/05/2016    Past Surgical History:  Procedure Laterality Date  . kidney stone removal Left 2001       No family history on file.  Social History   Tobacco Use  . Smoking status: Never Smoker  . Smokeless tobacco: Never Used  Substance Use Topics  . Alcohol use: No  . Drug use: No    Home Medications Prior to Admission medications   Not on File    Allergies    Patient has no known allergies.  Review of Systems   Review of Systems  Constitutional: Positive for chills and fever.  Respiratory: Positive for cough and shortness of breath.   Musculoskeletal: Positive for myalgias.  All other systems reviewed and are negative.   Physical Exam Updated Vital Signs BP (!) 135/97   Temp 99.1 F (37.3 C)   Resp 18   Ht 1.829 m (6')   Wt 102.1 kg   SpO2 96%   BMI 30.52 kg/m   Physical Exam CONSTITUTIONAL: Well developed/well nourished HEAD: Normocephalic/atraumatic EYES: EOMI/PERRL ENMT: Mask in place NECK: supple no meningeal signs SPINE/BACK:entire spine nontender CV: S1/S2 noted, no murmurs/rubs/gallops noted LUNGS:  Lungs are clear to auscultation bilaterally, no apparent distress ABDOMEN: soft, nontender NEURO: Pt is awake/alert/appropriate, moves all extremitiesx4.  No facial droop.   EXTREMITIES: pulses normal/equal, full ROM SKIN: warm, color normal PSYCH: no abnormalities of mood noted, alert and oriented to situation  ED Results / Procedures / Treatments   Labs (all labs ordered are listed, but only abnormal results are displayed) Labs Reviewed  RESP PANEL BY RT-PCR (FLU A&B, COVID) ARPGX2 - Abnormal; Notable for the following components:      Result Value   SARS Coronavirus 2 by RT PCR POSITIVE (*)    All other components within normal limits    EKG None  Radiology No results found.  Procedures Procedures    Medications Ordered in ED Medications - No data to display  ED Course  I have reviewed the triage vital signs and the nursing notes.  Pertinent labs  results that were available during my care of the patient were reviewed by me and considered in my medical decision making (see chart for details).    MDM Rules/Calculators/A&P                            Patient well-appearing.  No hypoxia or distress. He is still early in the course of COVID-19.  He is hesitant about receiving MAB infusion.  Sent message to infusion  team to contact patient. Work note provided  [name] was evaluated in Emergency Department on 08/06/2020 for the symptoms described in the history of present illness. He was evaluated in the context of the global COVID-19 pandemic, which necessitated consideration that the patient might be at risk for infection with the SARS-CoV-2 virus that causes COVID-19. Institutional protocols and algorithms that pertain to the evaluation of patients at risk for COVID-19 are in a state of rapid change based on information released by regulatory bodies including the CDC and federal and state organizations. These policies and algorithms were followed during the patient's care  in the ED.  Final Clinical Impression(s) / ED Diagnoses Final diagnoses:  Cough  COVID-19    Rx / DC Orders ED Discharge Orders    None       Zadie Rhine, MD 08/06/20 [phone]

## 2020-08-06 NOTE — ED Triage Notes (Addendum)
Pt c/o cough, fever, chills, for the past few days. States he did a home test last night which was positive for covid. States he came to the ED because of the positive result. Denies any any chest pain. C/o of a little sob. Took tylenol at 2200

## 2020-08-07 ENCOUNTER — Encounter: Payer: Self-pay | Admitting: Physician Assistant

## 2020-08-07 ENCOUNTER — Telehealth: Payer: Self-pay | Admitting: Physician Assistant

## 2020-08-07 DIAGNOSIS — U071 COVID-19: Secondary | ICD-10-CM | POA: Insufficient documentation

## 2020-08-07 DIAGNOSIS — Z6830 Body mass index (BMI) 30.0-30.9, adult: Secondary | ICD-10-CM | POA: Insufficient documentation

## 2020-08-07 NOTE — Telephone Encounter (Signed)
Called to discuss with Billey Gosling about Covid symptoms and the use of sotrovimab, casirivimab/imdevimab or bamlanivimab/etesevimab infusion for those with mild to moderate Covid symptoms and at a high risk of hospitalization.     Pt is qualified for this infusion at the monoclonal antibody infusion center due to co-morbid conditions and/or a member of an at-risk group (BMI>30), however would like to think more about the infusion at this time. Symptoms tier reviewed as well as criteria for ending isolation.  Symptoms reviewed that would warrant ED/Hospital evaluation. Preventative practices reviewed. Patient verbalized understanding. Patient advised to call back if he decides that he does want to get infusion. Callback number to the infusion center given. Patient advised to go to Urgent care or ED with severe symptoms. Last date pt would be eligible for infusion is 12/14. He will call his insurance company and check on cost first before signing up.     Patient Active Problem List   Diagnosis Date Noted  . BMI 30.0-30.9,adult   . COVID-19   . Leg swelling 11/29/2019  . Bilateral knee effusions 09/21/2019  . Left knee pain 12/05/2016    Cline Crock PA-C

## 2020-08-23 ENCOUNTER — Encounter (HOSPITAL_BASED_OUTPATIENT_CLINIC_OR_DEPARTMENT_OTHER): Payer: Self-pay | Admitting: *Deleted

## 2020-08-23 ENCOUNTER — Emergency Department (HOSPITAL_BASED_OUTPATIENT_CLINIC_OR_DEPARTMENT_OTHER)
Admission: EM | Admit: 2020-08-23 | Discharge: 2020-08-23 | Disposition: A | Discharge status: Home / Self Care | Payer: BC Managed Care – PPO | Attending: Emergency Medicine | Admitting: Emergency Medicine

## 2020-08-23 ENCOUNTER — Emergency Department (HOSPITAL_BASED_OUTPATIENT_CLINIC_OR_DEPARTMENT_OTHER): Payer: BC Managed Care – PPO

## 2020-08-23 ENCOUNTER — Other Ambulatory Visit: Payer: Self-pay

## 2020-08-23 DIAGNOSIS — M542 Cervicalgia: Secondary | ICD-10-CM | POA: Diagnosis not present

## 2020-08-23 DIAGNOSIS — R070 Pain in throat: Secondary | ICD-10-CM | POA: Diagnosis present

## 2020-08-23 DIAGNOSIS — Z8616 Personal history of COVID-19: Secondary | ICD-10-CM | POA: Insufficient documentation

## 2020-08-23 DIAGNOSIS — J029 Acute pharyngitis, unspecified: Secondary | ICD-10-CM | POA: Insufficient documentation

## 2020-08-23 LAB — CBC WITH DIFFERENTIAL/PLATELET
Abs Immature Granulocytes: 0.14 10*3/uL — ABNORMAL HIGH (ref 0.00–0.07)
Basophils Absolute: 0 10*3/uL (ref 0.0–0.1)
Basophils Relative: 0 %
Eosinophils Absolute: 0.1 10*3/uL (ref 0.0–0.5)
Eosinophils Relative: 1 %
HCT: 38.9 % — ABNORMAL LOW (ref 39.0–52.0)
Hemoglobin: 12.5 g/dL — ABNORMAL LOW (ref 13.0–17.0)
Immature Granulocytes: 1 %
Lymphocytes Relative: 14 %
Lymphs Abs: 1.5 10*3/uL (ref 0.7–4.0)
MCH: 30.5 pg (ref 26.0–34.0)
MCHC: 32.1 g/dL (ref 30.0–36.0)
MCV: 94.9 fL (ref 80.0–100.0)
Monocytes Absolute: 0.8 10*3/uL (ref 0.1–1.0)
Monocytes Relative: 7 %
Neutro Abs: 8.5 10*3/uL — ABNORMAL HIGH (ref 1.7–7.7)
Neutrophils Relative %: 77 %
Platelets: 348 10*3/uL (ref 150–400)
RBC: 4.1 MIL/uL — ABNORMAL LOW (ref 4.22–5.81)
RDW: 12.3 % (ref 11.5–15.5)
WBC: 11.2 10*3/uL — ABNORMAL HIGH (ref 4.0–10.5)
nRBC: 0 % (ref 0.0–0.2)

## 2020-08-23 LAB — BASIC METABOLIC PANEL
Anion gap: 10 (ref 5–15)
BUN: 19 mg/dL (ref 6–20)
CO2: 25 mmol/L (ref 22–32)
Calcium: 9.2 mg/dL (ref 8.9–10.3)
Chloride: 103 mmol/L (ref 98–111)
Creatinine, Ser: 0.86 mg/dL (ref 0.61–1.24)
GFR, Estimated: >60 mL/min (ref >60)
Glucose, Bld: 100 mg/dL — ABNORMAL HIGH (ref 70–99)
Potassium: 3.9 mmol/L (ref 3.5–5.1)
Sodium: 138 mmol/L (ref 135–145)

## 2020-08-23 LAB — GROUP A STREP BY PCR: Group A Strep by PCR: NOT DETECTED

## 2020-08-23 MED ORDER — IBUPROFEN 400 MG PO TABS
600.0000 mg | ORAL_TABLET | Freq: Once | ORAL | Status: AC
  Administered 2020-08-23: 600 mg via ORAL
  Filled 2020-08-23: qty 1

## 2020-08-23 MED ORDER — IOHEXOL 300 MG/ML  SOLN
75.0000 mL | Freq: Once | INTRAMUSCULAR | Status: AC | PRN
  Administered 2020-08-23: 75 mL via INTRAVENOUS

## 2020-08-23 MED ORDER — CLINDAMYCIN HCL 300 MG PO CAPS: 300.0000 mg | ORAL_CAPSULE | Freq: Four times a day (QID) | ORAL | 0 refills | Status: AC

## 2020-08-23 NOTE — ED Triage Notes (Signed)
C/o sore throat , COVid + x 1 week

## 2020-08-23 NOTE — Discharge Instructions (Addendum)
Your CT showed some slight swelling on the left side of your neck.  If your symptoms do not improve or at any time worsen you will need a repeat CT scan which your family doctor can arrange.  I worsen come back to the ER.

## 2020-08-23 NOTE — ED Provider Notes (Signed)
[facility] EMERGENCY DEPARTMENT Provider Note   CSN: [id number] Arrival date & time: 08/23/20  1512     History Chief Complaint  Patient presents with  . Covid Positive    Rowdy Guerrini is a 59 y.o. male.  HPI 59 year old male presents with left-sided neck pain and sore throat.  Patient states this has been ongoing for 3-4 days.  Wonders if he slept on it wrong.  He feels like there is pain and swelling to his lateral neck and some sore throat and pain with swallowing.  He tested positive for Covid on 12/5.  Some lingering cough but no shortness of breath.  No fever or ear pain.   Past Medical History:  Diagnosis Date  . BMI 30.0-30.9,adult   . COVID-19   . Kidney stone     Patient Active Problem List   Diagnosis Date Noted  . BMI 30.0-30.9,adult   . COVID-19   . Leg swelling 11/29/2019  . Bilateral knee effusions 09/21/2019  . Left knee pain 12/05/2016    Past Surgical History:  Procedure Laterality Date  . kidney stone removal Left 2001       No family history on file.  Social History   Tobacco Use  . Smoking status: Never Smoker  . Smokeless tobacco: Never Used  Substance Use Topics  . Alcohol use: No  . Drug use: No    Home Medications Prior to Admission medications   Medication Sig Start Date End Date Taking? Authorizing Provider  amoxicillin (AMOXIL) 500 MG tablet Take 500 mg by mouth 3 (three) times daily. 07/18/20   [provider]  clindamycin (CLEOCIN) 300 MG capsule Take 1 capsule (300 mg total) by mouth 4 (four) times daily. X 7 days 08/23/20   [name], MD    Allergies    Patient has no known allergies.  Review of Systems   Review of Systems  Constitutional: Negative for fever.  HENT: Positive for sore throat. Negative for ear pain.   Respiratory: Positive for cough. Negative for shortness of breath.   Musculoskeletal: Positive for neck pain.  All other systems reviewed and are negative.   Physical  Exam Updated Vital Signs BP (!) 130/97   Pulse 79   Temp 98.2 F (36.8 C) (Oral)   Resp 16   Ht 6' (1.829 m)   Wt 93 kg   SpO2 97%   BMI 27.80 kg/m   Physical Exam Vitals and nursing note reviewed.  Constitutional:      Appearance: He is well-developed and well-nourished.  HENT:     Head: Normocephalic and atraumatic.     Right Ear: External ear normal.     Left Ear: External ear normal.     Nose: Nose normal.     Mouth/Throat:     Pharynx: No oropharyngeal exudate or posterior oropharyngeal erythema.     Comments: No tonsillar abscess Eyes:     General:        Right eye: No discharge.        Left eye: No discharge.  Cardiovascular:     Rate and Rhythm: Normal rate and regular rhythm.     Heart sounds: Normal heart sounds.  Pulmonary:     Effort: Pulmonary effort is normal.     Breath sounds: Normal breath sounds.  Abdominal:     Palpations: Abdomen is soft.     Tenderness: There is no abdominal tenderness.  Musculoskeletal:        General: No edema.  Cervical back: Neck supple. Tenderness (along left lateral neck/SCM - no obvious lymphadenopathy) present. No rigidity.  Skin:    General: Skin is warm and dry.  Neurological:     Mental Status: He is alert.  Psychiatric:        Mood and Affect: Mood is not anxious.     ED Results / Procedures / Treatments   Labs (all labs ordered are listed, but only abnormal results are displayed) Labs Reviewed  BASIC METABOLIC PANEL - Abnormal; Notable for the following components:      Result Value   Glucose, Bld 100 (*)    All other components within normal limits  CBC WITH DIFFERENTIAL/PLATELET - Abnormal; Notable for the following components:   WBC 11.2 (*)    RBC 4.10 (*)    Hemoglobin 12.5 (*)    HCT 38.9 (*)    Neutro Abs 8.5 (*)    Abs Immature Granulocytes 0.14 (*)    All other components within normal limits  GROUP A STREP BY PCR    EKG None  Radiology CT Soft Tissue Neck W Contrast  Result Date:  08/23/2020 CLINICAL DATA:  Neck abscess, deep tissue. Additional history obtained from electronic MEDICAL RECORD NUMBERPatient reports sore throat, COVID positive for 1 week. EXAM: CT NECK WITH CONTRAST TECHNIQUE: Multidetector CT imaging of the neck was performed using the standard protocol following the bolus administration of intravenous contrast. CONTRAST:  70mL OMNIPAQUE IOHEXOL 300 MG/ML  SOLN COMPARISON:  No pertinent prior exams available for comparison. FINDINGS: Pharynx and larynx: Poor dentition with multiple absent and carious teeth and multifocal periapical lucencies. There is subtle nonspecific edema within the left parapharyngeal space (for instance as seen on series 2, images 40-44). There is no peripheral enhancement at this site to suggest formed abscess at this time. Otherwise, there is no appreciable swelling or discrete mass within the oral cavity, pharynx or larynx. Postinflammatory calcifications within the bilateral palatine tonsil. No retropharyngeal abscess. The epiglottis is unremarkable in appearance. Salivary glands: No inflammation, mass, or stone. Thyroid: Unremarkable. Lymph nodes: No pathologically enlarged cervical chain lymph nodes are identified. Vascular: The major vascular structures of the neck are patent. Limited intracranial: No acute intracranial abnormality identified. Visualized orbits: No mass or acute finding. Mastoids and visualized paranasal sinuses: No significant paranasal sinus disease at the imaged levels. Small left mastoid effusion. Skeleton: No acute bony abnormality or aggressive osseous lesion. Upper chest: Patchy opacities within the imaged lung apices. IMPRESSION: Subtle edema within the inferior left parapharyngeal space which is nonspecific, but may reflect sequela of pharyngitis given the provided history. No peripheral enhancement at this site to suggest formed abscess at this time. If the patient's symptoms persist or worsen, consider a follow-up CT. No  other appreciable swelling within the oral cavity, pharynx or larynx. No retropharyngeal abscess. The epiglottis is unremarkable. Poor dentition with multiple absent and carious teeth and multifocal periapical lucencies. Patchy opacities within the imaged lung apices, likely reflecting COVID pneumonia given the provided history. Electronically Signed   By: Jackey Loge DO   On: 08/23/2020 17:54    Procedures Procedures (including critical care time)  Medications Ordered in ED Medications  ibuprofen (ADVIL) tablet 600 mg (600 mg Oral Given 08/23/20 1554)  iohexol (OMNIPAQUE) 300 MG/ML solution 75 mL (75 mLs Intravenous Contrast Given 08/23/20 1706)    ED Course  I have reviewed the triage vital signs and the nursing notes.  Pertinent labs & imaging results that were available during my care  of the patient were reviewed by me and considered in my medical decision making (see chart for details).    MDM Rules/Calculators/A&P                          Patient's oral exam is fairly unremarkable.  CT was obtained given the pain with range of motion and no clear trauma with possible swelling.  The CT shows some nonspecific edema but no abscess or epiglottitis.  Given this, I think it is reasonable to treat with some antibiotics and return if worsening or not improving.  Will discharge.  Rocklin Soderquist was evaluated in Emergency Department on 08/23/2020 for the symptoms described in the history of present illness. He was evaluated in the context of the global COVID-19 pandemic, which necessitated consideration that the patient might be at risk for infection with the SARS-CoV-2 virus that causes COVID-19. Institutional protocols and algorithms that pertain to the evaluation of patients at risk for COVID-19 are in a state of rapid change based on information released by regulatory bodies including the CDC and federal and state organizations. These policies and algorithms were followed during the patient's  care in the ED.  Final Clinical Impression(s) / ED Diagnoses Final diagnoses:  Pharyngitis, unspecified etiology    Rx / DC Orders ED Discharge Orders         Ordered    clindamycin (CLEOCIN) 300 MG capsule  4 times daily        08/23/20 1812           [name], MD 08/23/20 1850

## 2020-08-27 ENCOUNTER — Encounter (HOSPITAL_BASED_OUTPATIENT_CLINIC_OR_DEPARTMENT_OTHER): Payer: Self-pay | Admitting: Emergency Medicine

## 2020-08-27 ENCOUNTER — Emergency Department (HOSPITAL_BASED_OUTPATIENT_CLINIC_OR_DEPARTMENT_OTHER)
Admission: EM | Admit: 2020-08-27 | Discharge: 2020-08-27 | Disposition: A | Discharge status: Home / Self Care | Payer: BC Managed Care – PPO | Attending: Emergency Medicine | Admitting: Emergency Medicine

## 2020-08-27 ENCOUNTER — Emergency Department (HOSPITAL_BASED_OUTPATIENT_CLINIC_OR_DEPARTMENT_OTHER): Payer: BC Managed Care – PPO

## 2020-08-27 ENCOUNTER — Other Ambulatory Visit: Payer: Self-pay

## 2020-08-27 DIAGNOSIS — R079 Chest pain, unspecified: Secondary | ICD-10-CM | POA: Diagnosis not present

## 2020-08-27 DIAGNOSIS — R109 Unspecified abdominal pain: Secondary | ICD-10-CM | POA: Diagnosis not present

## 2020-08-27 DIAGNOSIS — Z87442 Personal history of urinary calculi: Secondary | ICD-10-CM | POA: Diagnosis not present

## 2020-08-27 DIAGNOSIS — Z8616 Personal history of COVID-19: Secondary | ICD-10-CM | POA: Insufficient documentation

## 2020-08-27 LAB — CBC WITH DIFFERENTIAL/PLATELET
Abs Immature Granulocytes: 0.07 10*3/uL (ref 0.00–0.07)
Basophils Absolute: 0 10*3/uL (ref 0.0–0.1)
Basophils Relative: 0 %
Eosinophils Absolute: 0.1 10*3/uL (ref 0.0–0.5)
Eosinophils Relative: 1 %
HCT: 40.8 % (ref 39.0–52.0)
Hemoglobin: 12.7 g/dL — ABNORMAL LOW (ref 13.0–17.0)
Immature Granulocytes: 1 %
Lymphocytes Relative: 13 %
Lymphs Abs: 1.2 10*3/uL (ref 0.7–4.0)
MCH: 29.6 pg (ref 26.0–34.0)
MCHC: 31.1 g/dL (ref 30.0–36.0)
MCV: 95.1 fL (ref 80.0–100.0)
Monocytes Absolute: 1 10*3/uL (ref 0.1–1.0)
Monocytes Relative: 11 %
Neutro Abs: 6.8 10*3/uL (ref 1.7–7.7)
Neutrophils Relative %: 74 %
Platelets: 289 10*3/uL (ref 150–400)
RBC: 4.29 MIL/uL (ref 4.22–5.81)
RDW: 12.7 % (ref 11.5–15.5)
WBC: 9.1 10*3/uL (ref 4.0–10.5)
nRBC: 0 % (ref 0.0–0.2)

## 2020-08-27 LAB — BASIC METABOLIC PANEL
Anion gap: 10 (ref 5–15)
BUN: 23 mg/dL — ABNORMAL HIGH (ref 6–20)
CO2: 22 mmol/L (ref 22–32)
Calcium: 9.4 mg/dL (ref 8.9–10.3)
Chloride: 105 mmol/L (ref 98–111)
Creatinine, Ser: 0.92 mg/dL (ref 0.61–1.24)
GFR, Estimated: >60 mL/min (ref >60)
Glucose, Bld: 106 mg/dL — ABNORMAL HIGH (ref 70–99)
Potassium: 4.2 mmol/L (ref 3.5–5.1)
Sodium: 137 mmol/L (ref 135–145)

## 2020-08-27 LAB — D-DIMER, QUANTITATIVE: D-Dimer, Quant: 0.39 ug/mL-FEU (ref 0.00–0.50)

## 2020-08-27 NOTE — Discharge Instructions (Addendum)
You were seen today for lower chest and right flank pain.  Your work-up is reassuring.  Take Tylenol or Motrin as needed for any pain.

## 2020-08-27 NOTE — ED Provider Notes (Signed)
[facility] EMERGENCY DEPARTMENT Provider Note   CSN: [id number] Arrival date & time: 08/27/20  0157     History Chief Complaint  Patient presents with   Chest Pain    [name] is a 59 y.o. male.  HPI     This 59 year old male with recent history of COVID-19 diagnosed on December 5 who presents with right-sided flank pain.  Patient reports onset of sharp right-sided flank/chest pain earlier this evening.  He was lying in bed when it happened.  He rolled over and developed pain.  Pain is since subsided.  He is currently pain-free.  He denies any worsening pain with breathing.  He did not take anything for the pain.  Denies shortness of breath or ongoing cough.  No ongoing fevers.  Past Medical History:  Diagnosis Date   BMI 30.0-30.9,adult    COVID-19    Kidney stone     Patient Active Problem List   Diagnosis Date Noted   BMI 30.0-30.9,adult    COVID-19    Leg swelling 11/29/2019   Bilateral knee effusions 09/21/2019   Left knee pain 12/05/2016    Past Surgical History:  Procedure Laterality Date   kidney stone removal Left 2001       No family history on file.  Social History   Tobacco Use   Smoking status: Never Smoker   Smokeless tobacco: Never Used  Substance Use Topics   Alcohol use: No   Drug use: No    Home Medications Prior to Admission medications   Medication Sig Start Date End Date Taking? Authorizing Provider  clindamycin (CLEOCIN) 300 MG capsule Take 1 capsule (300 mg total) by mouth 4 (four) times daily. X 7 days 08/23/20  Yes [name], MD  amoxicillin (AMOXIL) 500 MG tablet Take 500 mg by mouth 3 (three) times daily. 07/18/20   [provider]    Allergies    Patient has no known allergies.  Review of Systems   Review of Systems  Constitutional: Negative for fever.  Respiratory: Negative for shortness of breath.   Cardiovascular: Positive for chest pain.  Gastrointestinal: Negative for  abdominal pain, diarrhea, nausea and vomiting.  Genitourinary: Positive for flank pain. Negative for dysuria and hematuria.  Neurological: Negative for headaches.  All other systems reviewed and are negative.   Physical Exam Updated Vital Signs BP 103/76 (BP Location: Right Arm)    Pulse 90    Temp 98 F (36.7 C) (Oral)    Resp 18    Ht 1.829 m (6')    Wt 90.7 kg    SpO2 96%    BMI 27.12 kg/m   Physical Exam Vitals and nursing note reviewed.  Constitutional:      Appearance: He is well-developed and well-nourished.  HENT:     Head: Normocephalic and atraumatic.  Eyes:     Pupils: Pupils are equal, round, and reactive to light.  Cardiovascular:     Rate and Rhythm: Normal rate and regular rhythm.     Heart sounds: Normal heart sounds. No murmur heard.   Pulmonary:     Effort: Pulmonary effort is normal. No respiratory distress.     Breath sounds: Normal breath sounds. No wheezing.  Chest:     Chest wall: No tenderness or crepitus.  Abdominal:     General: Bowel sounds are normal.     Palpations: Abdomen is soft.     Tenderness: There is no abdominal tenderness. There is no rebound.  Musculoskeletal:  General: No edema.     Cervical back: Neck supple.     Right lower leg: No tenderness. No edema.     Left lower leg: No tenderness. No edema.  Lymphadenopathy:     Cervical: No cervical adenopathy.  Skin:    General: Skin is warm and dry.  Neurological:     Mental Status: He is alert and oriented to person, place, and time.  Psychiatric:        Mood and Affect: Mood and affect and mood normal.     ED Results / Procedures / Treatments   Labs (all labs ordered are listed, but only abnormal results are displayed) Labs Reviewed  CBC WITH DIFFERENTIAL/PLATELET - Abnormal; Notable for the following components:      Result Value   Hemoglobin 12.7 (*)    All other components within normal limits  BASIC METABOLIC PANEL - Abnormal; Notable for the following  components:   Glucose, Bld 106 (*)    BUN 23 (*)    All other components within normal limits  D-DIMER, QUANTITATIVE (NOT AT [facility])    EKG EKG Interpretation  Date/Time:  Sunday August 27 2020 02:08:30 EST Ventricular Rate:  92 PR Interval:  140 QRS Duration: 132 QT Interval:  376 QTC Calculation: 464 R Axis:   -80 Text Interpretation: Normal sinus rhythm Right bundle branch block Left anterior fascicular block  Bifascicular block  Minimal voltage criteria for LVH, may be normal variant ( R in aVL ) Septal infarct , age undetermined Abnormal ECG No significant change since last tracing Confirmed by Ross Marcus ([id number]) on 08/27/2020 5:28:38 AM   Radiology DG Chest 2 View  Result Date: 08/27/2020 CLINICAL DATA:  Low chest pain. EXAM: CHEST - 2 VIEW COMPARISON:  2021 FINDINGS: Stable asymmetric elevation left hemidiaphragm. There is streaky bibasilar atelectasis or infiltrate. No pleural effusion. The visualized bony structures of the thorax show no acute abnormality. Telemetry leads overlie the chest. IMPRESSION: Stable exam.  Streaky bibasilar atelectasis or infiltrate. Electronically Signed   By: Kennith Center M.D.   On: 08/27/2020 04:58    Procedures Procedures (including critical care time)  Medications Ordered in ED Medications - No data to display  ED Course  I have reviewed the triage vital signs and the nursing notes.  Pertinent labs & imaging results that were available during my care of the patient were reviewed by me and considered in my medical decision making (see chart for details).    MDM Rules/Calculators/A&P                          Patient presents with self-limited right-sided chest and flank pain.  He is overall nontoxic and vital signs are reassuring.  Pain has resolved.  Pain is quite atypical for ACS.  EKG without acute ischemic changes.  Given recent COVID-19 diagnosis, PE is a consideration although his vital signs and pulse ox are reassuring.   Will screen with a D-dimer.  Chest x-ray obtained.  No evidence of pneumothorax or pneumonia.  He denies any urinary symptoms and pain is really in his right lower chest/upper flank based on where he localizes it.  Lab work is largely reassuring.  D-dimer is negative.  Low suspicion for PE.  Patient has remained pain-free.  After history, exam, and medical workup I feel the patient has been appropriately medically screened and is safe for discharge home. Pertinent diagnoses were discussed with the patient. Patient was given return  precautions.   Final Clinical Impression(s) / ED Diagnoses Final diagnoses:  Flank pain    Rx / DC Orders ED Discharge Orders    None       Mariajose Mow, Mayer Masker, MD 08/27/20 [phone]

## 2020-08-27 NOTE — ED Triage Notes (Signed)
Reports rolling over in bed when he had a pain in the right rib cage.  Still on quarantine for covid.+.

## 2020-11-08 ENCOUNTER — Other Ambulatory Visit (HOSPITAL_COMMUNITY): Payer: Self-pay | Admitting: Emergency Medicine

## 2020-11-08 ENCOUNTER — Emergency Department (HOSPITAL_BASED_OUTPATIENT_CLINIC_OR_DEPARTMENT_OTHER)
Admission: EM | Admit: 2020-11-08 | Discharge: 2020-11-08 | Disposition: A | Discharge status: Home / Self Care | Payer: BC Managed Care – PPO | Attending: Emergency Medicine | Admitting: Emergency Medicine

## 2020-11-08 ENCOUNTER — Other Ambulatory Visit: Payer: Self-pay

## 2020-11-08 DIAGNOSIS — R112 Nausea with vomiting, unspecified: Secondary | ICD-10-CM | POA: Insufficient documentation

## 2020-11-08 DIAGNOSIS — R000 Tachycardia, unspecified: Secondary | ICD-10-CM | POA: Insufficient documentation

## 2020-11-08 DIAGNOSIS — R197 Diarrhea, unspecified: Secondary | ICD-10-CM | POA: Diagnosis not present

## 2020-11-08 DIAGNOSIS — Z8616 Personal history of COVID-19: Secondary | ICD-10-CM | POA: Insufficient documentation

## 2020-11-08 DIAGNOSIS — Z20822 Contact with and (suspected) exposure to covid-19: Secondary | ICD-10-CM | POA: Diagnosis not present

## 2020-11-08 DIAGNOSIS — R109 Unspecified abdominal pain: Secondary | ICD-10-CM | POA: Diagnosis not present

## 2020-11-08 LAB — SARS CORONAVIRUS 2 (TAT 6-24 HRS): SARS Coronavirus 2: POSITIVE — AB

## 2020-11-08 LAB — CBC WITH DIFFERENTIAL/PLATELET
Abs Immature Granulocytes: 0.02 10*3/uL (ref 0.00–0.07)
Basophils Absolute: 0 10*3/uL (ref 0.0–0.1)
Basophils Relative: 0 %
Eosinophils Absolute: 0 10*3/uL (ref 0.0–0.5)
Eosinophils Relative: 0 %
HCT: 43.2 % (ref 39.0–52.0)
Hemoglobin: 13.8 g/dL (ref 13.0–17.0)
Immature Granulocytes: 0 %
Lymphocytes Relative: 4 %
Lymphs Abs: 0.4 10*3/uL — ABNORMAL LOW (ref 0.7–4.0)
MCH: 29.8 pg (ref 26.0–34.0)
MCHC: 31.9 g/dL (ref 30.0–36.0)
MCV: 93.3 fL (ref 80.0–100.0)
Monocytes Absolute: 0.4 10*3/uL (ref 0.1–1.0)
Monocytes Relative: 3 %
Neutro Abs: 9.8 10*3/uL — ABNORMAL HIGH (ref 1.7–7.7)
Neutrophils Relative %: 93 %
Platelets: 255 10*3/uL (ref 150–400)
RBC: 4.63 MIL/uL (ref 4.22–5.81)
RDW: 12.1 % (ref 11.5–15.5)
WBC: 10.6 10*3/uL — ABNORMAL HIGH (ref 4.0–10.5)
nRBC: 0 % (ref 0.0–0.2)

## 2020-11-08 LAB — COMPREHENSIVE METABOLIC PANEL
ALT: 16 U/L (ref 0–44)
AST: 22 U/L (ref 15–41)
Albumin: 4.2 g/dL (ref 3.5–5.0)
Alkaline Phosphatase: 84 U/L (ref 38–126)
Anion gap: 9 (ref 5–15)
BUN: 19 mg/dL (ref 6–20)
CO2: 22 mmol/L (ref 22–32)
Calcium: 9.3 mg/dL (ref 8.9–10.3)
Chloride: 106 mmol/L (ref 98–111)
Creatinine, Ser: 0.95 mg/dL (ref 0.61–1.24)
GFR, Estimated: >60 mL/min (ref >60)
Glucose, Bld: 113 mg/dL — ABNORMAL HIGH (ref 70–99)
Potassium: 3.8 mmol/L (ref 3.5–5.1)
Sodium: 137 mmol/L (ref 135–145)
Total Bilirubin: 1.1 mg/dL (ref 0.3–1.2)
Total Protein: 7.6 g/dL (ref 6.5–8.1)

## 2020-11-08 LAB — LIPASE, BLOOD: Lipase: 35 U/L (ref 11–51)

## 2020-11-08 MED ORDER — ONDANSETRON 4 MG PO TBDP: 4.0000 mg | ORAL_TABLET | Freq: Three times a day (TID) | ORAL | 0 refills | Status: AC | PRN

## 2020-11-08 MED ORDER — LACTATED RINGERS IV BOLUS
1000.0000 mL | Freq: Once | INTRAVENOUS | Status: AC
  Administered 2020-11-08: 1000 mL via INTRAVENOUS

## 2020-11-08 MED ORDER — ONDANSETRON HCL 4 MG/2ML IJ SOLN
4.0000 mg | Freq: Once | INTRAMUSCULAR | Status: AC
  Administered 2020-11-08: 4 mg via INTRAVENOUS
  Filled 2020-11-08: qty 2

## 2020-11-08 MED FILL — ONDANSETRON ODT 4 MG TABLET: 4 | 3 days supply | Qty: 10 | Fill #0

## 2020-11-08 NOTE — ED Triage Notes (Signed)
Sudden onset last pm of abd pain w n/v/d

## 2020-11-08 NOTE — Discharge Instructions (Addendum)
You can take over-the-counter Tylenol Motrin for pain.  Use the Zofran for nausea vomiting.  Buy over-the-counter Imodium for diarrhea.  Hydrate well with water and Gatorade.  Return to [place] with any concerning changes.  The Covid test is pending at time of discharge.  Instructions on how to follow this up on my chart are on your discharge paperwork, you can also call the department if you are having trouble finding these results.  If he/she is Covid positive he/she will need to be quarantine for total 5 days since the onset of symptoms +24 hours of no fever and resolving symptoms, additionally he/she needs to wear a mask near all others for 5 more days. If he/she is not Covid positive he/she is able to go back to normal day-to-day routine as long as he/she is not having fevers and it has been 24 hours since his/her last fever.

## 2020-11-08 NOTE — ED Provider Notes (Signed)
[facility] EMERGENCY DEPARTMENT Provider Note   CSN: [id number] Arrival date & time: 11/08/20  1245     History Chief Complaint  Patient presents with  . Abdominal Pain    [name] is a 60 y.o. male.  The history is provided by the patient.  Emesis Severity:  Moderate Duration:  1 day Timing:  Constant Quality:  Stomach contents Progression:  Partially resolved Chronicity:  New Recent urination:  Normal Relieved by:  Nothing Worsened by:  Nothing Ineffective treatments:  None tried Associated symptoms: abdominal pain and diarrhea   Associated symptoms: no arthralgias, no chills, no cough, no fever and no headaches        Past Medical History:  Diagnosis Date  . BMI 30.0-30.9,adult   . COVID-19   . Kidney stone     Patient Active Problem List   Diagnosis Date Noted  . BMI 30.0-30.9,adult   . COVID-19   . Leg swelling 11/29/2019  . Bilateral knee effusions 09/21/2019  . Left knee pain 12/05/2016    Past Surgical History:  Procedure Laterality Date  . kidney stone removal Left 2001       No family history on file.  Social History   Tobacco Use  . Smoking status: Never Smoker  . Smokeless tobacco: Never Used  Substance Use Topics  . Alcohol use: No  . Drug use: No    Home Medications Prior to Admission medications   Medication Sig Start Date End Date Taking? Authorizing Provider  ondansetron (ZOFRAN ODT) 4 MG disintegrating tablet Take 1 tablet (4 mg total) by mouth every 8 (eight) hours as needed for up to 10 doses for nausea or vomiting. 11/08/20  Yes Sabino Donovan, MD  amoxicillin (AMOXIL) 500 MG tablet Take 500 mg by mouth 3 (three) times daily. 07/18/20   [provider]  clindamycin (CLEOCIN) 300 MG capsule Take 1 capsule (300 mg total) by mouth 4 (four) times daily. X 7 days 08/23/20   [name], MD    Allergies    Patient has no known allergies.  Review of Systems   Review of Systems  Constitutional:  Negative for chills and fever.  HENT: Negative for congestion and rhinorrhea.   Respiratory: Negative for cough and shortness of breath.   Cardiovascular: Negative for chest pain and palpitations.  Gastrointestinal: Positive for abdominal pain, diarrhea and vomiting. Negative for nausea.  Genitourinary: Negative for difficulty urinating and dysuria.  Musculoskeletal: Negative for arthralgias and back pain.  Skin: Negative for color change and rash.  Neurological: Negative for light-headedness and headaches.    Physical Exam Updated Vital Signs BP (!) 133/99 (BP Location: Right Arm)   Pulse 96   Temp 98.6 F (37 C)   Resp 18   Ht 6' (1.829 m)   Wt 99.8 kg   SpO2 92%   BMI 29.84 kg/m   Physical Exam Vitals and nursing note reviewed.  Constitutional:      General: He is not in acute distress.    Appearance: Normal appearance.  HENT:     Head: Normocephalic and atraumatic.     Nose: No rhinorrhea.     Mouth/Throat:     Mouth: Mucous membranes are moist.  Eyes:     General:        Right eye: No discharge.        Left eye: No discharge.     Conjunctiva/sclera: Conjunctivae normal.  Cardiovascular:     Rate and Rhythm: Regular rhythm. Tachycardia  present.  Pulmonary:     Effort: Pulmonary effort is normal.     Breath sounds: No stridor.  Abdominal:     General: Abdomen is flat. There is no distension.     Palpations: Abdomen is soft.     Tenderness: There is no abdominal tenderness. There is no guarding or rebound. Negative signs include Rovsing's sign.  Musculoskeletal:        General: No deformity or signs of injury.  Skin:    General: Skin is warm and dry.     Capillary Refill: Capillary refill takes less than 2 seconds.  Neurological:     General: No focal deficit present.     Mental Status: He is alert. Mental status is at baseline.     Motor: No weakness.  Psychiatric:        Mood and Affect: Mood normal.        Behavior: Behavior normal.        Thought  Content: Thought content normal.     ED Results / Procedures / Treatments   Labs (all labs ordered are listed, but only abnormal results are displayed) Labs Reviewed  CBC WITH DIFFERENTIAL/PLATELET - Abnormal; Notable for the following components:      Result Value   WBC 10.6 (*)    Neutro Abs 9.8 (*)    Lymphs Abs 0.4 (*)    All other components within normal limits  COMPREHENSIVE METABOLIC PANEL - Abnormal; Notable for the following components:   Glucose, Bld 113 (*)    All other components within normal limits  SARS CORONAVIRUS 2 (TAT 6-24 HRS)  LIPASE, BLOOD    EKG None  Radiology No results found.  Procedures Procedures   Medications Ordered in ED Medications  lactated ringers bolus 1,000 mL (1,000 mLs Intravenous New Bag/Given 11/08/20 0817)  ondansetron (ZOFRAN) injection 4 mg (4 mg Intravenous Given 11/08/20 9379)    ED Course  I have reviewed the triage vital signs and the nursing notes.  Pertinent labs & imaging results that were available during my care of the patient were reviewed by me and considered in my medical decision making (see chart for details).    MDM Rules/Calculators/A&P                          Likely viral gastroenteritis however tachycardic.  Abdomen without signs of peritonitis.  Will get basic labs will give IV fluids will give Zofran.  Will reassess.  Covid test ordered.  Otherwise well-appearing no acute complaints symptoms resolving.  Laboratory studies are fairly unremarkable for any acute changes after my review.  Patient abdominal exam remains benign.  Heart rate improved after fluids.  Symptoms improving.  Safe for discharge home.  Return precautions given.  Zofran given.  Final Clinical Impression(s) / ED Diagnoses Final diagnoses:  Nausea vomiting and diarrhea    Rx / DC Orders ED Discharge Orders         Ordered    ondansetron (ZOFRAN ODT) 4 MG disintegrating tablet  Every 8 hours PRN        11/08/20 0903            Sabino Donovan, MD 11/08/20 [id number]

## 2020-11-09 ENCOUNTER — Telehealth: Payer: Self-pay

## 2020-11-09 NOTE — Telephone Encounter (Signed)
Called to discuss with patient about COVID-19 symptoms and the use of one of the available treatments for those with mild to moderate Covid symptoms and at a high risk of hospitalization.  Pt appears to qualify for outpatient treatment due to co-morbid conditions and/or a member of an at-risk group in accordance with the FDA Emergency Use Authorization.    Symptom onset: 11/07/20 Nausea,vomiting,diarrhea Vaccinated: Yes Booster? No Immunocompromised? No Qualifiers: Obesity  Pt. Declines any further treatment.  [name]

## 2021-01-20 IMAGING — US US EXTREM LOW VENOUS*L*
1 series · 14 of 24 positions shown · non-contrast
Comparison: none

CLINICAL DATA: Swelling of the left leg.

EXAM:
Left LOWER EXTREMITY VENOUS DOPPLER ULTRASOUND
TECHNIQUE: Gray-scale sonography with compression, as well as color and duplex
ultrasound, were performed to evaluate the deep venous system(s)
from the level of the common femoral vein through the popliteal and
proximal calf veins.

[Series 1: us extrem low venous*left* · 14 of 31 slices shown]
[im 1/31]
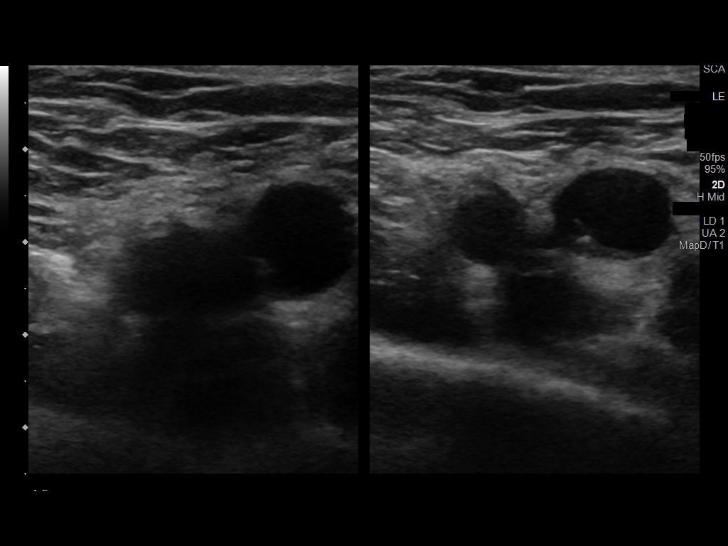
[im 3/31]
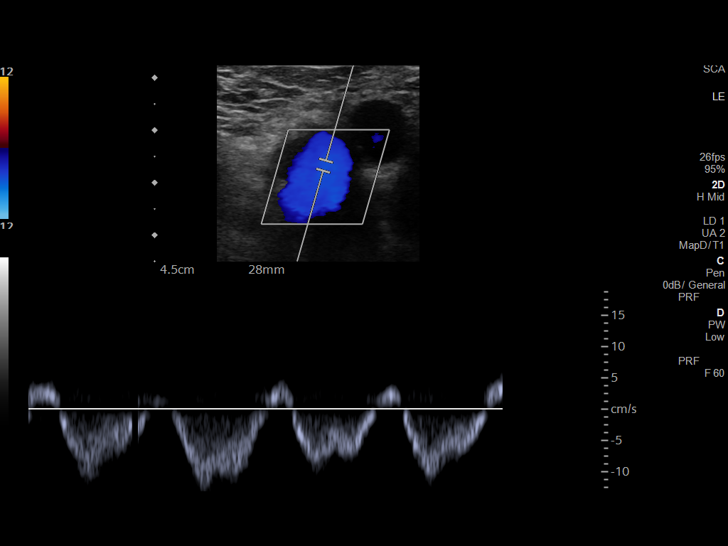
[im 6/31]
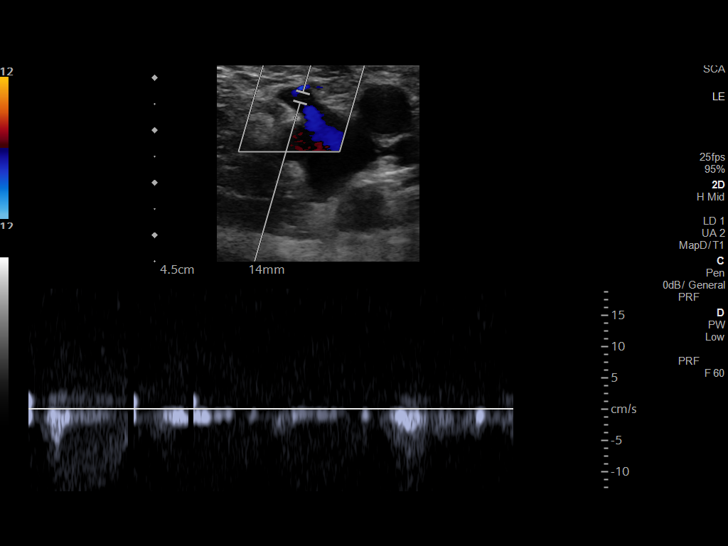
[im 8/31]
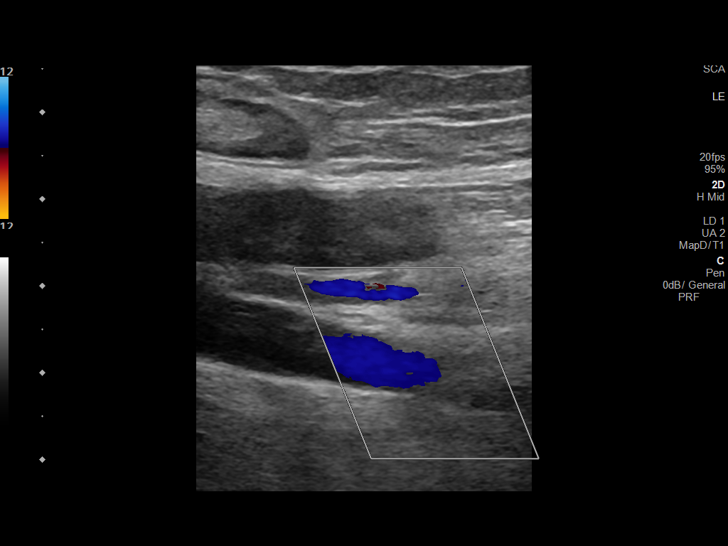
[im 10/31]
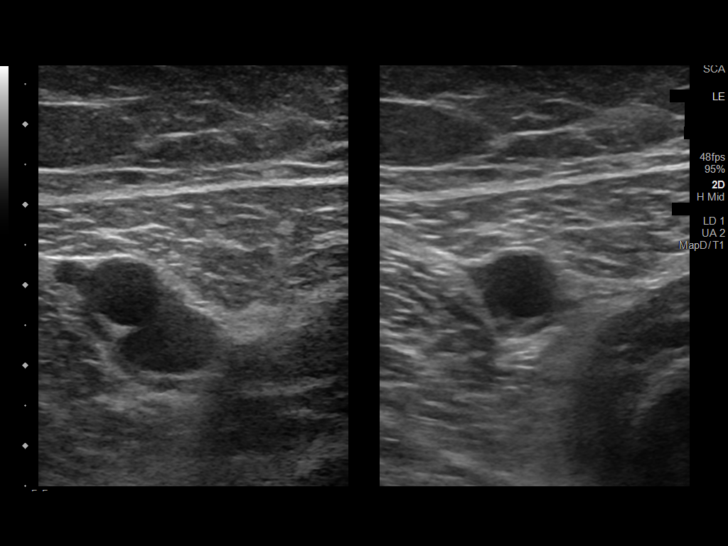
[im 12/31]
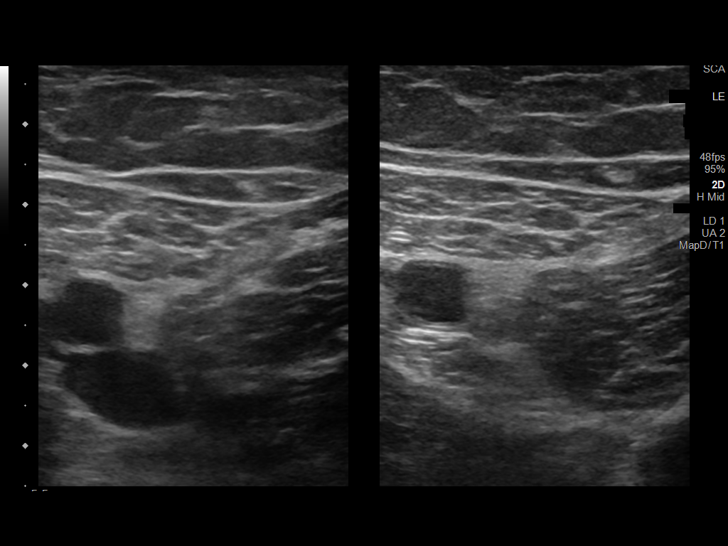
[im 15/31]
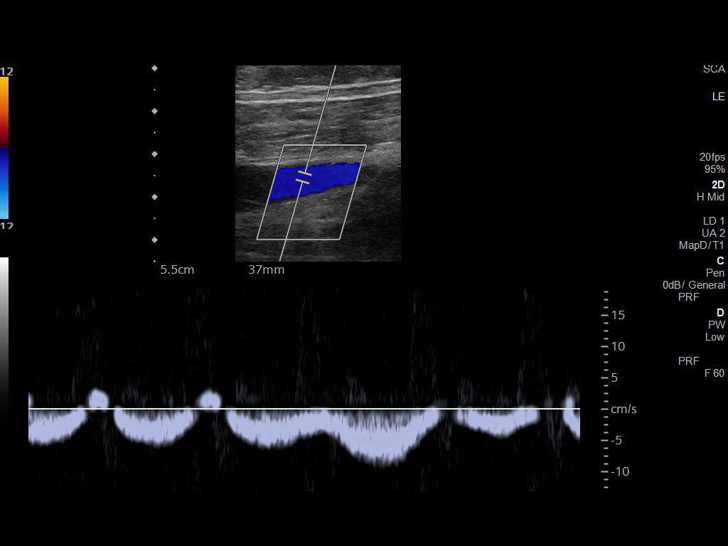
[im 16/31]
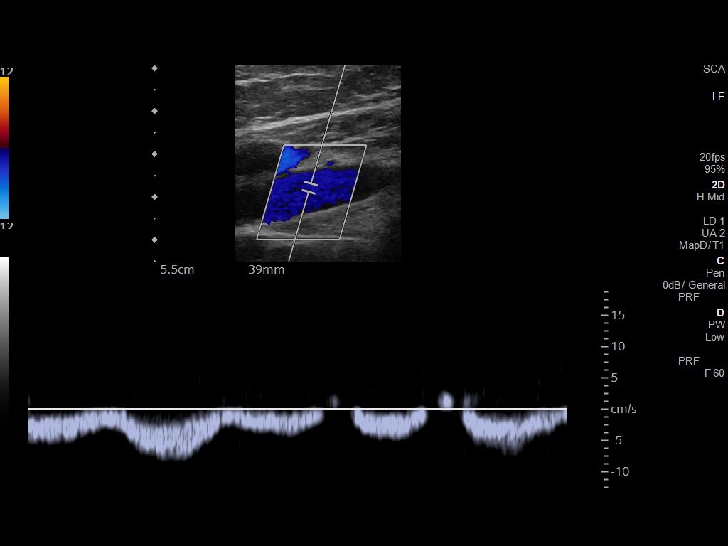
[im 19/31]
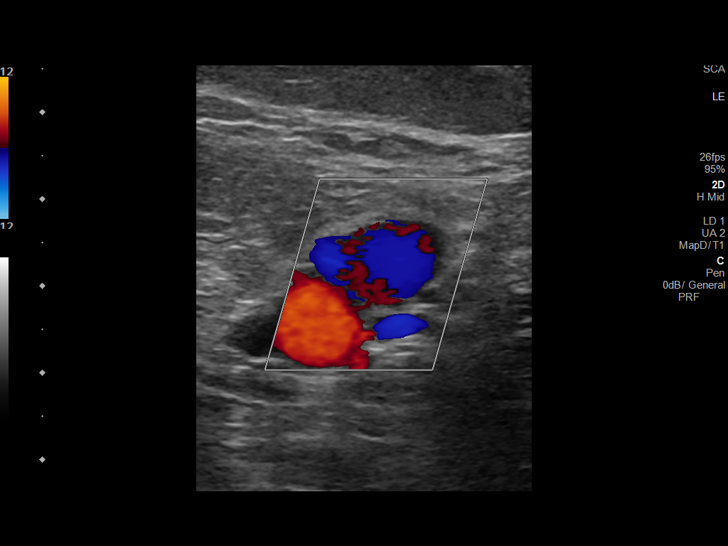
[im 21/31]
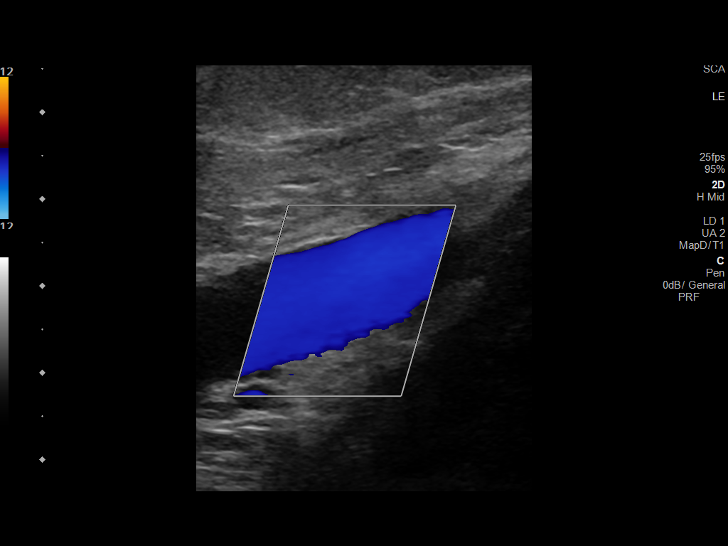
[im 24/31]
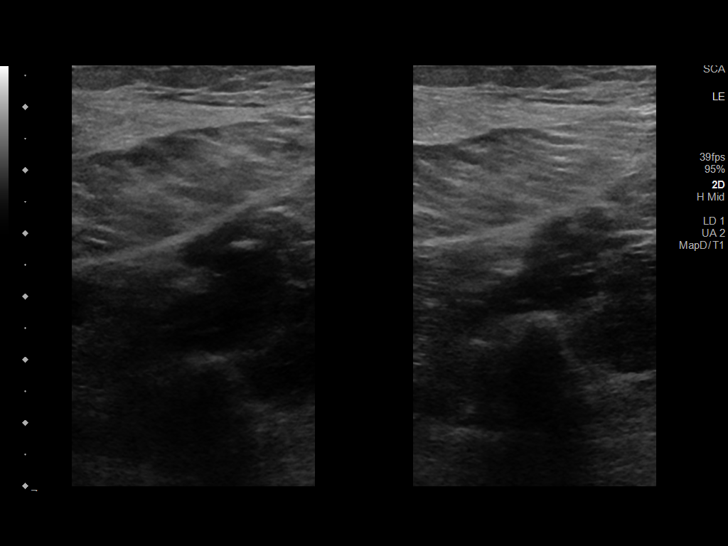
[im 25/31]
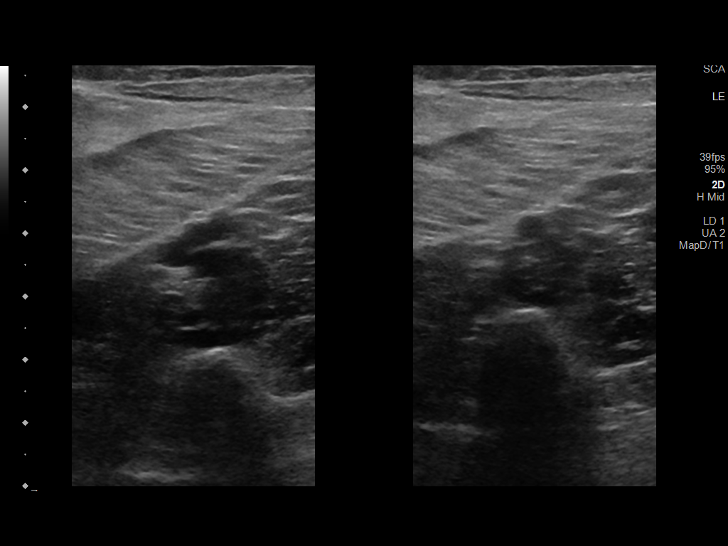
[im 28/31]
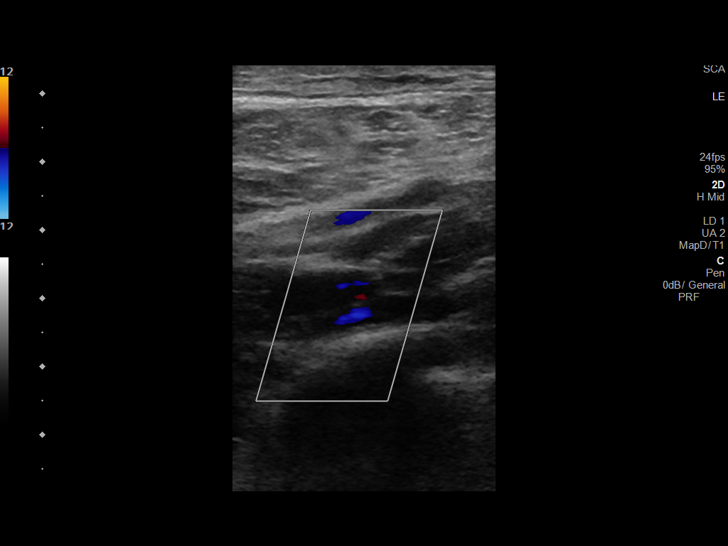
[im 31/31]
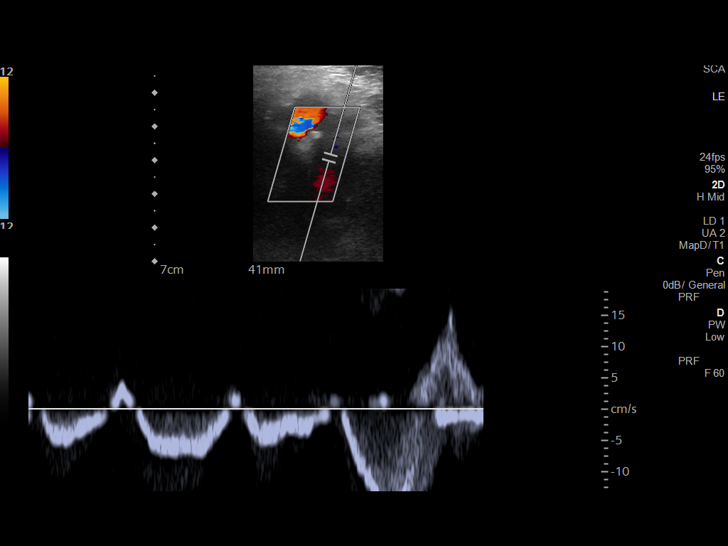

[14 of 24 positions shown; findings below may reference images not displayed]

FINDINGS: VENOUS

Normal compressibility of the common femoral, superficial femoral,
and popliteal veins, as well as the visualized calf veins.
Visualized portions of profunda femoral vein and great saphenous
vein unremarkable. No filling defects to suggest DVT on grayscale or
color Doppler imaging. Doppler waveforms show normal direction of
venous flow, normal respiratory phasicity and response to
augmentation.

Limited views of the contralateral common femoral vein are
unremarkable.

OTHER

None.

Limitations: none
IMPRESSION: No femoropopliteal DVT nor evidence of DVT within the visualized
calf veins.

If clinical symptoms are inconsistent or if there are persistent or
worsening symptoms, further imaging (possibly involving the iliac
veins) may be warranted.

## 2021-03-01 ENCOUNTER — Other Ambulatory Visit (HOSPITAL_COMMUNITY): Payer: Self-pay

## 2021-06-18 ENCOUNTER — Other Ambulatory Visit (HOSPITAL_COMMUNITY): Payer: Self-pay

## 2021-09-13 ENCOUNTER — Other Ambulatory Visit (HOSPITAL_COMMUNITY): Payer: Self-pay

## 2021-09-21 ENCOUNTER — Encounter (HOSPITAL_COMMUNITY): Payer: Self-pay

## 2021-09-21 ENCOUNTER — Other Ambulatory Visit: Payer: Self-pay

## 2021-09-21 ENCOUNTER — Emergency Department (HOSPITAL_COMMUNITY)
Admission: EM | Admit: 2021-09-21 | Discharge: 2021-09-21 | Disposition: A | Discharge status: Home / Self Care | Payer: BC Managed Care – PPO | Attending: Emergency Medicine | Admitting: Emergency Medicine

## 2021-09-21 DIAGNOSIS — M7989 Other specified soft tissue disorders: Secondary | ICD-10-CM | POA: Insufficient documentation

## 2021-09-21 DIAGNOSIS — X501XXA Overexertion from prolonged static or awkward postures, initial encounter: Secondary | ICD-10-CM | POA: Insufficient documentation

## 2021-09-21 DIAGNOSIS — M25562 Pain in left knee: Secondary | ICD-10-CM | POA: Insufficient documentation

## 2021-09-21 DIAGNOSIS — Y990 Civilian activity done for income or pay: Secondary | ICD-10-CM | POA: Diagnosis not present

## 2021-09-21 MED ORDER — NAPROXEN 500 MG PO TABS: 500.0000 mg | ORAL_TABLET | Freq: Two times a day (BID) | ORAL | 0 refills | Status: AC

## 2021-09-21 NOTE — Discharge Instructions (Addendum)
Wear knee sleeve while at work. Use naproxen twice daily to help with pain and inflammation. Take this medication with food.  Call to schedule follow up with your orthopedist.

## 2021-09-21 NOTE — ED Provider Notes (Signed)
[facility]-EMERGENCY DEPT Provider Note   CSN: [id number] Arrival date & time: 09/21/21  2202     History  Chief Complaint  Patient presents with   Knee Pain    [name] is a 61 y.o. male.  [name] is a 61 y.o. male who is otherwise healthy, presents to the ED for evaluation of left knee pain.  He reports he has had intermittent issues with his left knee for years.  He sees an orthopedist at [facility] and has gotten steroid injections in the knee intermittently, last injection was probably about 5 months ago.  He reports last night while he was at work he was on his feet for the entire shift and was going up and down stairs often started having increasing pain in the left knee.  He reports it feels mildly swollen.  Pain is worse with weightbearing.  He has not noticed any overlying redness, warmth or had any fevers.  He has not taken anything to treat his knee pain.  Denies any injury to the knee while working last night.  The history is provided by the spouse and the patient.      Home Medications Prior to Admission medications   Medication Sig Start Date End Date Taking? Authorizing Provider  naproxen (NAPROSYN) 500 MG tablet Take 1 tablet (500 mg total) by mouth 2 (two) times daily. 09/21/21  Yes [name], PA-C  amoxicillin (AMOXIL) 500 MG tablet Take 500 mg by mouth 3 (three) times daily. 07/18/20   [provider]  clindamycin (CLEOCIN) 300 MG capsule Take 1 capsule (300 mg total) by mouth 4 (four) times daily. X 7 days 08/23/20   [name], MD  ondansetron (ZOFRAN ODT) 4 MG disintegrating tablet Take 1 tablet (4 mg total) by mouth every 8 (eight) hours as needed for up to 10 doses for nausea or vomiting. 11/08/20   Sabino Donovan, MD  ondansetron (ZOFRAN-ODT) 4 MG disintegrating tablet DISSOLVE 1 TABLET BY MOUTH EVERY 8 HOURS AS NEEDED FOR UP TO 10 DOSES FOR NAUSEA AND/OR VOMITING 11/08/20 11/08/21  Sabino Donovan, MD      Allergies     Patient has no known allergies.    Review of Systems   Review of Systems  Constitutional:  Negative for chills and fever.  Musculoskeletal:  Positive for arthralgias.   Physical Exam Updated Vital Signs BP (!) 149/98 (BP Location: Left Arm)    Pulse 63    Temp 97.9 F (36.6 C) (Oral)    Resp 16    Ht 6\' 1"  (1.854 m)    Wt 99.8 kg    SpO2 97%    BMI 29.03 kg/m  Physical Exam Vitals and nursing note reviewed.  Constitutional:      General: He is not in acute distress.    Appearance: Normal appearance. He is well-developed. He is not ill-appearing or diaphoretic.  HENT:     Head: Normocephalic and atraumatic.  Eyes:     General:        Right eye: No discharge.        Left eye: No discharge.  Pulmonary:     Effort: Pulmonary effort is normal. No respiratory distress.  Musculoskeletal:     Comments: Tenderness over the left knee primarily at the joint line, no overlying erythema, no palpable effusion, able to flex and extend the knee greater than 90 degrees.  Distal pulses 2+.  No palpable deformity  Skin:  General: Skin is warm and dry.  Neurological:     Mental Status: He is alert and oriented to person, place, and time.     Coordination: Coordination normal.  Psychiatric:        Mood and Affect: Mood normal.        Behavior: Behavior normal.    ED Results / Procedures / Treatments   Labs (all labs ordered are listed, but only abnormal results are displayed) Labs Reviewed - No data to display  EKG None  Radiology No results found.  Procedures Procedures    Medications Ordered in ED Medications - No data to display  ED Course/ Medical Decision Making/ A&P                           39-year-old male presents to the ED for evaluation of left knee pain.  This is an ongoing issue for the patient, he is followed by orthopedics and has received steroid injections.  Differential includes: Osteoarthritis, meniscal injury, ligamentous injury, fracture, septic arthritis,  gout   Was on his feet and going up and down stairs at work but does not have any acute trauma or injury to the knee.  On exam there is some tenderness over the knee, no signs of septic arthritis.  Patient does not have significant pain on palpation and is able to range the knee, low suspicion for gout.  Without acute trauma or injury to the knee and have low suspicion for fracture and do not think that x-ray would change management today.  Suspect this is an exacerbation of patient's ongoing left knee pain likely due to arthritis.  Patient provided with knee sleeve and prescribed naproxen for pain relief.  Encouraged to follow-up with his orthopedist as an outpatient.   At this time there does not appear to be any evidence of an acute emergency medical condition and the patient appears stable for discharge with appropriate outpatient follow up.Diagnosis was discussed with patient who verbalizes understanding and is agreeable to discharge.        Final Clinical Impression(s) / ED Diagnoses Final diagnoses:  Acute pain of left knee    Rx / DC Orders ED Discharge Orders          Ordered    naproxen (NAPROSYN) 500 MG tablet  2 times daily        09/21/21 2237              Legrand Rams 09/21/21 2259    Lorre Nick, MD 09/25/21 [id number]

## 2021-09-21 NOTE — ED Triage Notes (Signed)
Pt reports nontraumatic chronic left knee pain. Usually sees Orthopedics for steroid injection. Last night while walking in [employer] pain intensified.

## 2021-10-19 IMAGING — CR DG CHEST 2V
2 series · 2 of 2 positions shown · non-contrast
Comparison: 0609

CLINICAL DATA: Low chest pain.

EXAM:
CHEST - 2 VIEW

[w chest pa]
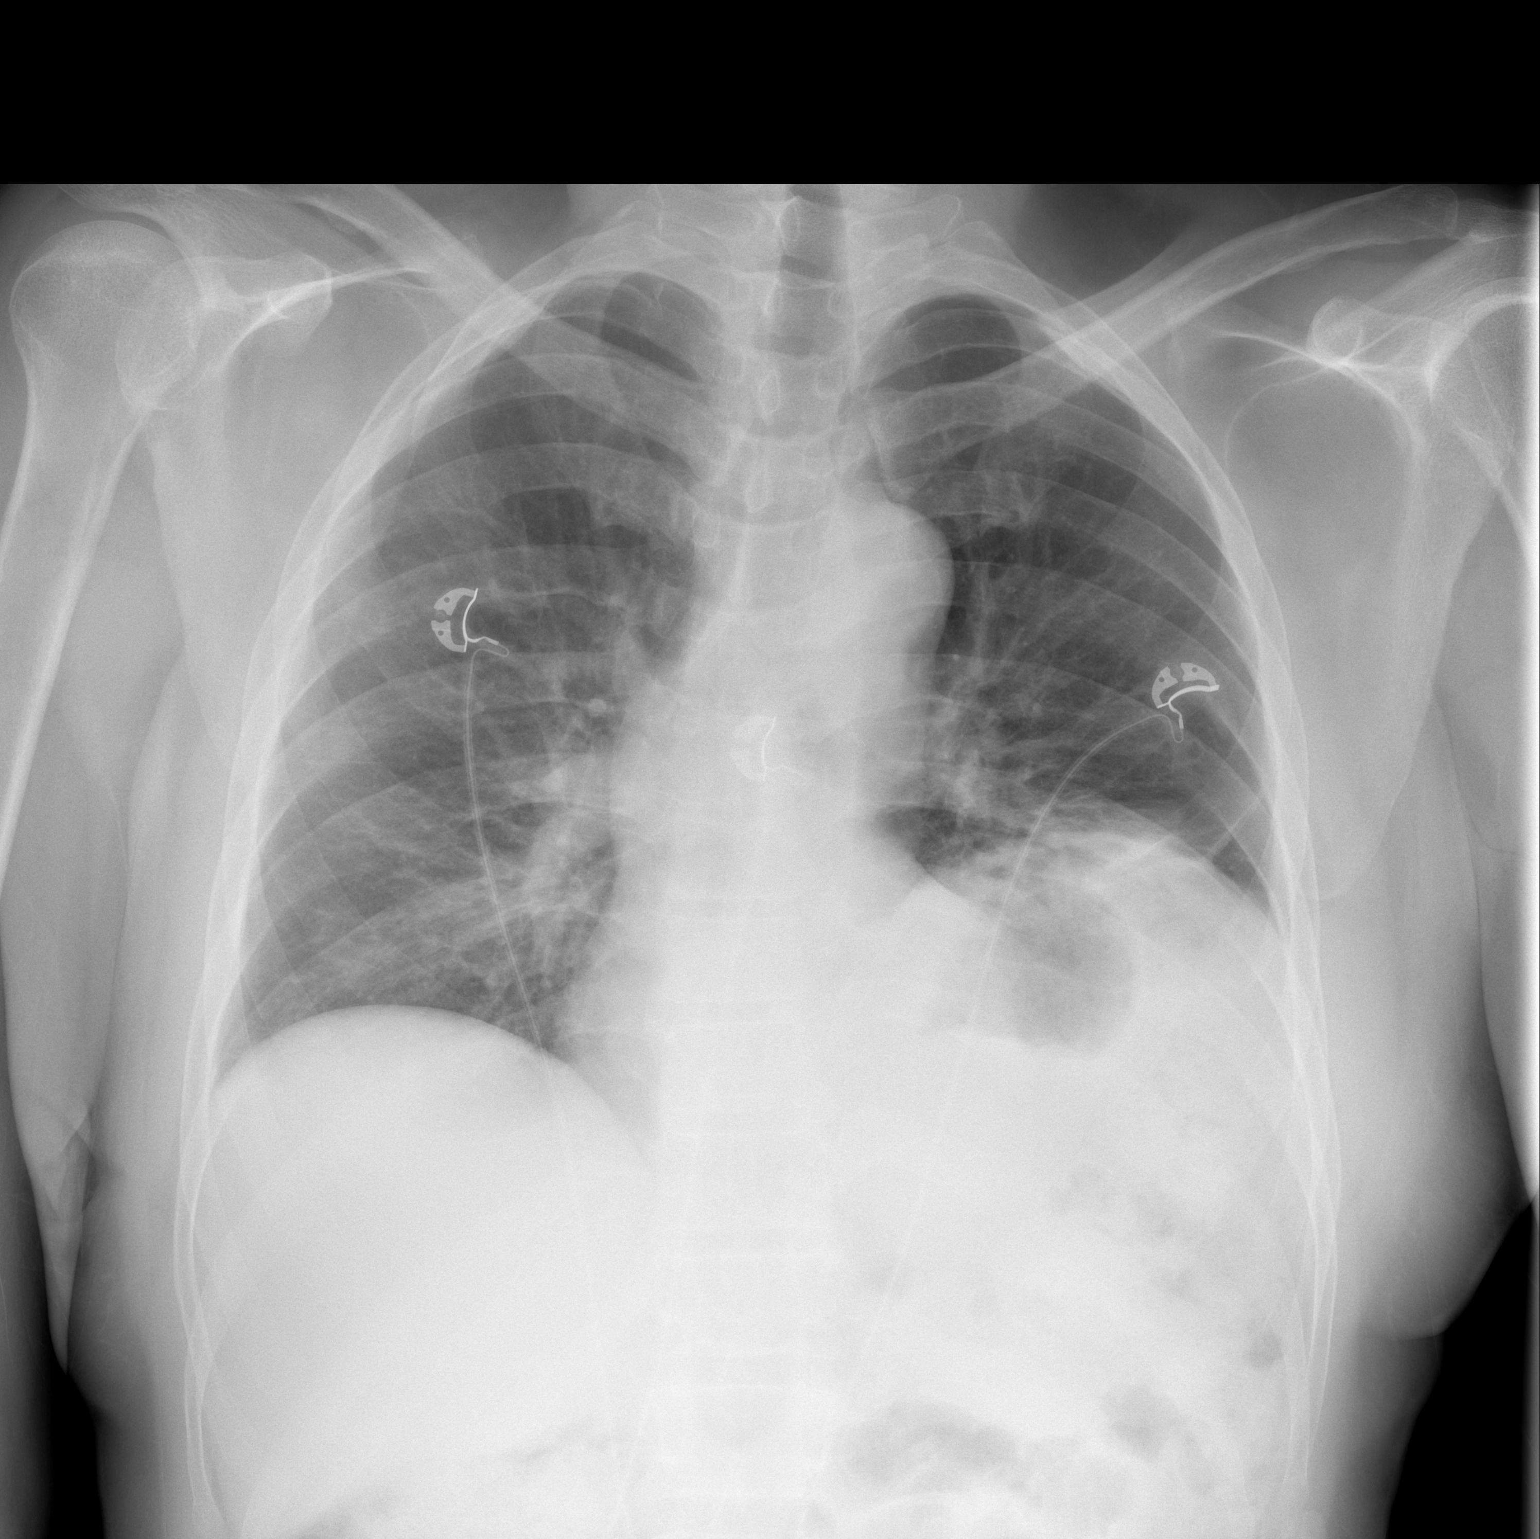

[w chest lat]
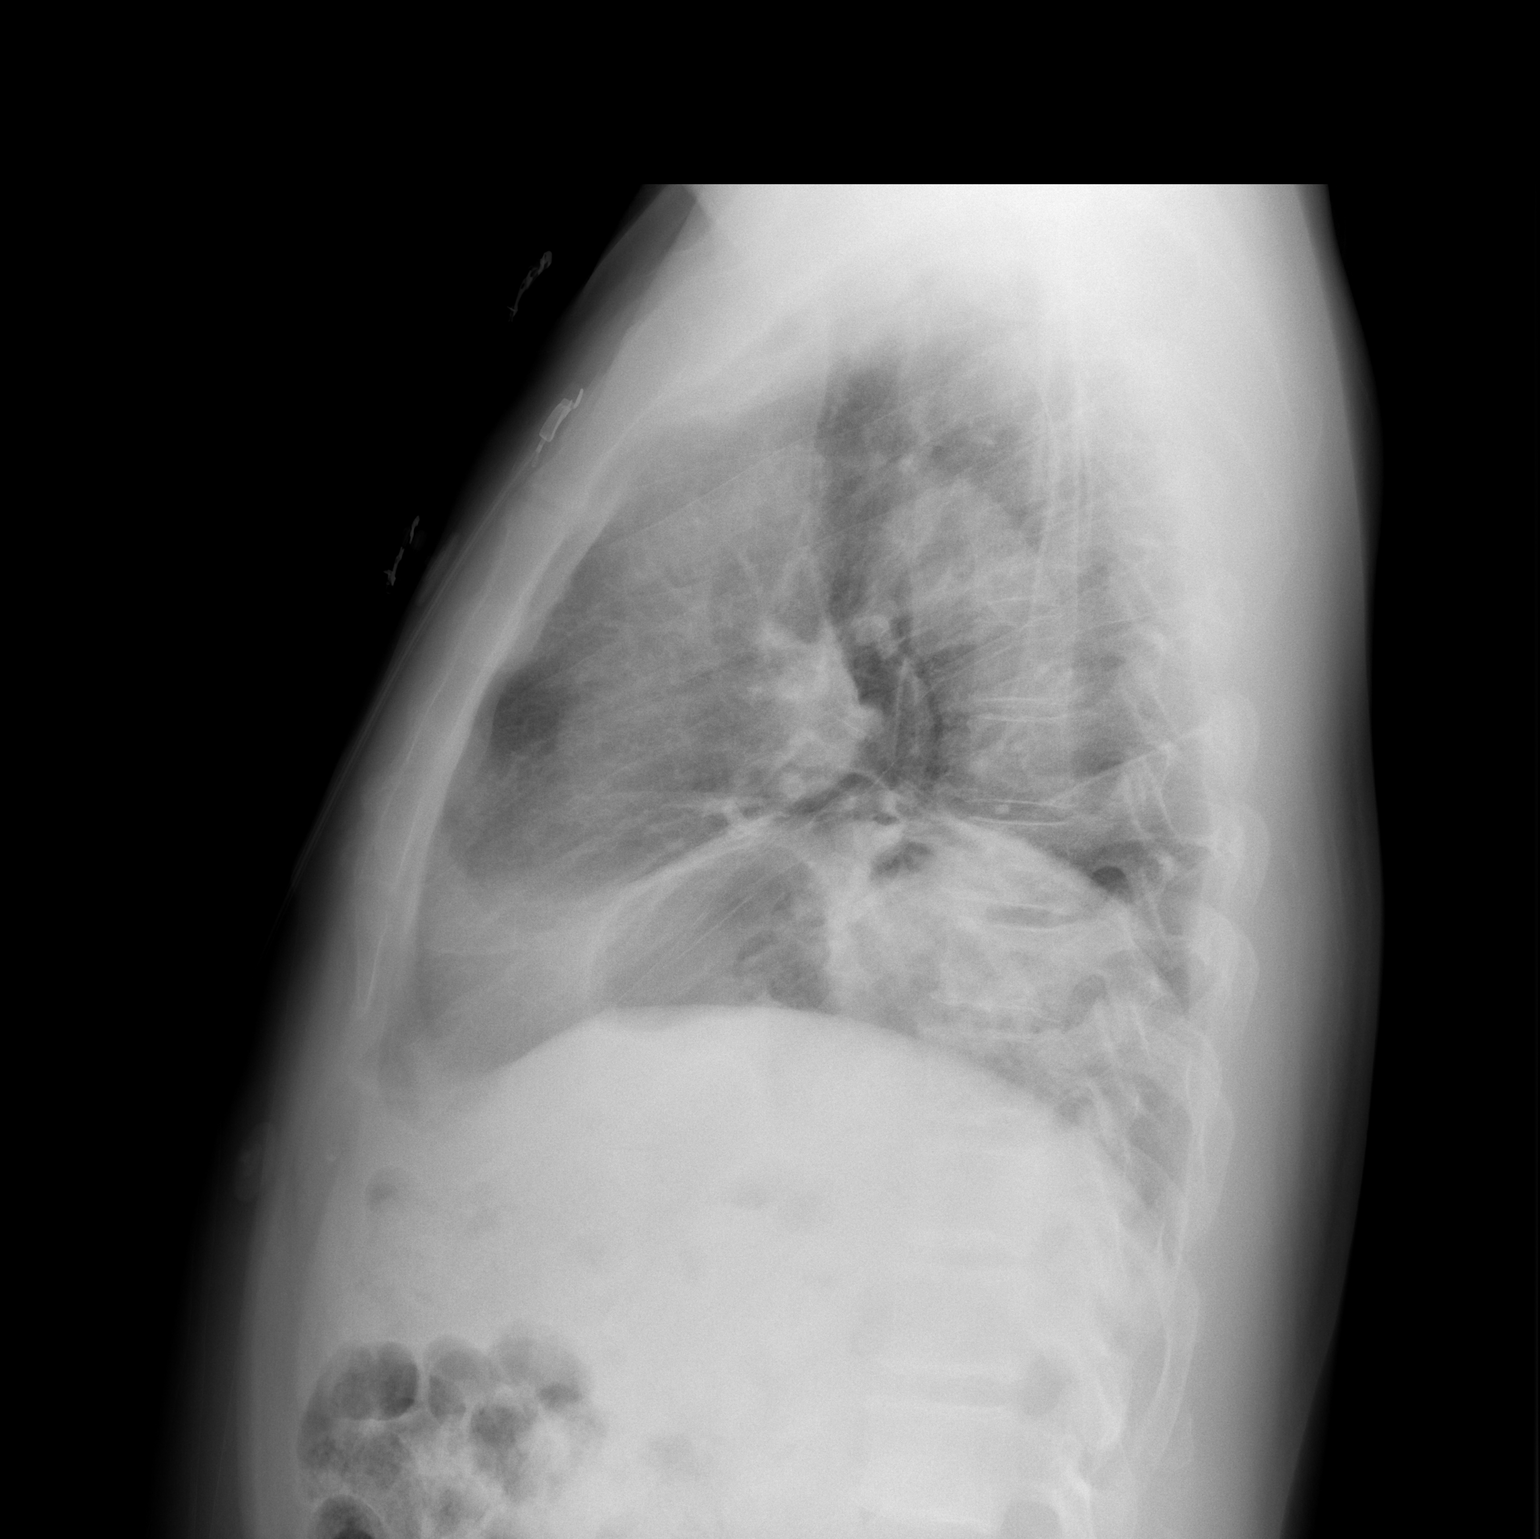

[2 of 2 positions shown; findings below may reference images not displayed]

FINDINGS: Stable asymmetric elevation left hemidiaphragm. There is streaky
bibasilar atelectasis or infiltrate. No pleural effusion. The
visualized bony structures of the thorax show no acute abnormality.
Telemetry leads overlie the chest.
IMPRESSION: Stable exam.  Streaky bibasilar atelectasis or infiltrate.

## 2022-03-25 ENCOUNTER — Other Ambulatory Visit: Payer: Self-pay

## 2022-04-01 ENCOUNTER — Emergency Department (HOSPITAL_BASED_OUTPATIENT_CLINIC_OR_DEPARTMENT_OTHER)
Admission: EM | Admit: 2022-04-01 | Discharge: 2022-04-01 | Disposition: A | Discharge status: Home / Self Care | Payer: BC Managed Care – PPO | Attending: Emergency Medicine | Admitting: Emergency Medicine

## 2022-04-01 ENCOUNTER — Encounter (HOSPITAL_BASED_OUTPATIENT_CLINIC_OR_DEPARTMENT_OTHER): Payer: Self-pay | Admitting: Emergency Medicine

## 2022-04-01 ENCOUNTER — Other Ambulatory Visit: Payer: Self-pay

## 2022-04-01 DIAGNOSIS — H6123 Impacted cerumen, bilateral: Secondary | ICD-10-CM | POA: Insufficient documentation

## 2022-04-01 DIAGNOSIS — H9203 Otalgia, bilateral: Secondary | ICD-10-CM | POA: Diagnosis present

## 2022-04-01 MED ORDER — DOCUSATE SODIUM 100 MG PO CAPS
100.0000 mg | ORAL_CAPSULE | Freq: Once | ORAL | Status: AC
  Administered 2022-04-01: 100 mg via ORAL
  Filled 2022-04-01: qty 1

## 2022-04-01 NOTE — ED Notes (Signed)
Large amount (nickel sized) of cerumen removed from left side of ear with water/hydrogen peroxide elephant ear wash.States immediate relief and improved hearing  Also had a dime sized amount of cerumen removed from right ear, states no improvement in hearing. Still able to see cerumen. Will ask for colace as unable to remove any more with elephant ear wash.

## 2022-04-01 NOTE — ED Provider Notes (Signed)
[facility] EMERGENCY DEPARTMENT Provider Note   CSN: [id number] Arrival date & time: 04/01/22  1307     History  Chief Complaint  Patient presents with   Ear Fullness    [name] is a 61 y.o. male who presents to the emergency department with concerns for bilateral ear fullness onset 1 day.  Has tried home remedies without relief of his symptoms.  Has associated decreased hearing noted bilaterally.  Denies fever, ear drainage.  The history is provided by the patient. No language interpreter was used.       Home Medications Prior to Admission medications   Medication Sig Start Date End Date Taking? Authorizing Provider  amoxicillin (AMOXIL) 500 MG tablet Take 500 mg by mouth 3 (three) times daily. 07/18/20   [provider]  clindamycin (CLEOCIN) 300 MG capsule Take 1 capsule (300 mg total) by mouth 4 (four) times daily. X 7 days 08/23/20   [name], MD  naproxen (NAPROSYN) 500 MG tablet Take 1 tablet (500 mg total) by mouth 2 (two) times daily. 09/21/21   [name], PA-C  ondansetron (ZOFRAN ODT) 4 MG disintegrating tablet Take 1 tablet (4 mg total) by mouth every 8 (eight) hours as needed for up to 10 doses for nausea or vomiting. 11/08/20   Sabino Donovan, MD      Allergies    Patient has no known allergies.    Review of Systems   Review of Systems  Constitutional:  Negative for fever.  HENT:  Negative for ear discharge and ear pain.   All other systems reviewed and are negative.   Physical Exam Updated Vital Signs BP (!) 138/100 (BP Location: Right Arm)   Pulse 80   Temp 98.1 F (36.7 C) (Oral)   Resp 18   Ht 6' (1.829 m)   Wt 96.2 kg   SpO2 92%   BMI 28.75 kg/m  Physical Exam Vitals and nursing note reviewed.  Constitutional:      General: He is not in acute distress.    Appearance: Normal appearance.  HENT:     Right Ear: There is impacted cerumen.     Left Ear: There is impacted cerumen.     Ears:     Comments: Unable  to visualize bilateral TMs, bilateral cerumen impaction noted on exam.  No tenderness to palpation noted to bilateral mastoids.  No tenderness to palpation noted to bilateral external ear. Eyes:     General: No scleral icterus.    Extraocular Movements: Extraocular movements intact.  Cardiovascular:     Rate and Rhythm: Normal rate.  Pulmonary:     Effort: Pulmonary effort is normal. No respiratory distress.  Abdominal:     Palpations: Abdomen is soft. There is no mass.     Tenderness: There is no abdominal tenderness.  Musculoskeletal:        General: Normal range of motion.     Cervical back: Neck supple.  Skin:    General: Skin is warm and dry.     Findings: No rash.  Neurological:     Mental Status: He is alert.     Sensory: Sensation is intact.     Motor: Motor function is intact.  Psychiatric:        Behavior: Behavior normal.     ED Results / Procedures / Treatments   Labs (all labs ordered are listed, but only abnormal results are displayed) Labs Reviewed - No data to display  EKG None  Radiology No results found.  Procedures Procedures    Medications Ordered in ED Medications  docusate sodium (COLACE) capsule 100 mg (100 mg Oral Given 04/01/22 1723)    ED Course/ Medical Decision Making/ A&P Clinical Course as of 04/01/22 1802  Mon Apr 01, 2022  1748 Visualize bilateral TMs prior to discharge, left TM visualized without erythema or bulging noted.  Unable to visualize right TM due to cerumen in place.  Discussed discharge treatment plan with patient at bedside.  Answered all available questions.  Patient appears safe for discharge at this time. [SB]    Clinical Course User Index [SB] Silvia Hightower A, PA-C                           Medical Decision Making Risk OTC drugs.   Pt presents with concerns for bilateral ear fullness onset yesterday. Vital signs, patient afebrile. On exam, pt with Unable to visualize bilateral TMs, bilateral cerumen impaction  noted on exam.  No tenderness to palpation noted to bilateral mastoids.  No tenderness to palpation noted to bilateral external ear. Differential diagnosis includes otitis media, otitis externa, mastoiditis, cerumen impaction.   Disposition: Presenting suspicious for bilateral cerumen impaction.  Cerumen removed from left TM by RN in the emergency department.  RN unable to remove all of cerumen from right TM.  Patient has improvement of symptoms.  Doubt otitis media, otitis externa, mastoiditis at this time.  After consideration of the diagnostic results and the patients response to treatment, I feel that the patient would benefit from Discharge home.  We will send patient home with information for ENT to follow-up regarding today's ED visit.  Supportive care measures and strict return precautions discussed with patient at bedside. Pt acknowledges and verbalizes understanding. Pt appears safe for discharge. Follow up as indicated in discharge paperwork.    This chart was dictated using voice recognition software, Dragon. Despite the best efforts of this provider to proofread and correct errors, errors may still occur which can change documentation meaning.   Final Clinical Impression(s) / ED Diagnoses Final diagnoses:  Bilateral impacted cerumen    Rx / DC Orders ED Discharge Orders     None         Mazey Mantell A, PA-C 04/01/22 1802    Jacalyn Lefevre, MD 04/01/22 1815

## 2022-04-01 NOTE — Discharge Instructions (Addendum)
It was a pleasure taking care of you today!   You may use over the counter debrox to aid with your earwax buildup.  You may follow-up with your primary care provider as needed regarding today's ED visit.  Attached is information for the ENT specialist, to set up a follow up appointment regarding todays ED visit should you need your ear wax removed in the future. Return to the ED if you experience increasing/worsening ear ringing, drainage, fullness, worsening symptoms

## 2022-04-01 NOTE — ED Triage Notes (Addendum)
Pt POV c/o progressively worsening difficulty hearing bilaterally, "feels like ear wax is built up in there." C/o left ear pain x1 day.  Home remedies ineffective.

## 2022-04-01 NOTE — ED Notes (Signed)
Unable to remove remainder of ear wax, provider notified.

## 2022-04-01 NOTE — ED Notes (Signed)
Discharge instructions reviewed with patient. Patient verbalizes understanding, no further questions at this time. Medications and follow up information provided. No acute distress noted at time of departure.  

## 2022-12-16 ENCOUNTER — Encounter: Payer: Self-pay | Admitting: *Deleted

## 2023-05-13 ENCOUNTER — Emergency Department (HOSPITAL_BASED_OUTPATIENT_CLINIC_OR_DEPARTMENT_OTHER)
Admission: EM | Admit: 2023-05-13 | Discharge: 2023-05-13 | Disposition: A | Discharge status: Home / Self Care | Payer: BC Managed Care – PPO | Attending: Emergency Medicine | Admitting: Emergency Medicine

## 2023-05-13 ENCOUNTER — Encounter (HOSPITAL_BASED_OUTPATIENT_CLINIC_OR_DEPARTMENT_OTHER): Payer: Self-pay | Admitting: Emergency Medicine

## 2023-05-13 ENCOUNTER — Other Ambulatory Visit: Payer: Self-pay

## 2023-05-13 DIAGNOSIS — Z20822 Contact with and (suspected) exposure to covid-19: Secondary | ICD-10-CM | POA: Insufficient documentation

## 2023-05-13 DIAGNOSIS — Z8616 Personal history of COVID-19: Secondary | ICD-10-CM | POA: Insufficient documentation

## 2023-05-13 DIAGNOSIS — J069 Acute upper respiratory infection, unspecified: Secondary | ICD-10-CM | POA: Insufficient documentation

## 2023-05-13 DIAGNOSIS — J029 Acute pharyngitis, unspecified: Secondary | ICD-10-CM | POA: Diagnosis present

## 2023-05-13 DIAGNOSIS — H6123 Impacted cerumen, bilateral: Secondary | ICD-10-CM | POA: Insufficient documentation

## 2023-05-13 LAB — RESP PANEL BY RT-PCR (RSV, FLU A&B, COVID)  RVPGX2
Influenza A by PCR: NEGATIVE
Influenza B by PCR: NEGATIVE
Resp Syncytial Virus by PCR: NEGATIVE
SARS Coronavirus 2 by RT PCR: NEGATIVE

## 2023-05-13 LAB — GROUP A STREP BY PCR: Group A Strep by PCR: NOT DETECTED

## 2023-05-13 NOTE — Discharge Instructions (Signed)
You have been seen today for your complaint of cough, congestion, runny nose. Your lab work was negative for flu, COVID, RSV, strep. Your discharge medications include Claritin and Flonase.  These are over-the-counter medications.  Use them as directed on the packaging until your symptoms resolve. Follow up with: A PCP of your choice in 1 week for reevaluation Please seek immediate medical care if you develop any of the following symptoms: Feel pain or pressure in your chest. Have trouble breathing. Faint or feel like you will faint. Have severe and persistent vomiting. Feel confused or disoriented. At this time there does not appear to be the presence of an emergent medical condition, however there is always the potential for conditions to change. Please read and follow the below instructions.  Do not take your medicine if  develop an itchy rash, swelling in your mouth or lips, or difficulty breathing; call 911 and seek immediate emergency medical attention if this occurs.  You may review your lab tests and imaging results in their entirety on your MyChart account.  Please discuss all results of fully with your primary care provider and other specialist at your follow-up visit.  Note: Portions of this text may have been transcribed using voice recognition software. Every effort was made to ensure accuracy; however, inadvertent computerized transcription errors may still be present.

## 2023-05-13 NOTE — ED Provider Notes (Signed)
[place] [facility] Provider Note   CSN: [id number] Arrival date & time: 05/13/23  1931     History  Chief Complaint  Patient presents with   Sore Throat    Symir Flitter is a 62 y.o. male.  Presenting to the ED for evaluation of sore throat, congestion, rhinorrhea.  Symptoms began on Friday.  States he works 2 jobs but does not know of any known sick contacts.  He states he was given some medicine by his wife yesterday which did not change his symptoms.  His symptoms have not gotten worse but have not gotten any better.  He also reports a cough which is nonproductive.  He denies any chest pain, shortness of breath, fevers, chills, fatigue, body aches, difficulty chewing or swallowing, voice changes.  States his symptoms feel similar to when he has had COVID in the past.   Sore Throat       Home Medications Prior to Admission medications   Medication Sig Start Date End Date Taking? Authorizing Provider  amoxicillin (AMOXIL) 500 MG tablet Take 500 mg by mouth 3 (three) times daily. 07/18/20   [provider]  clindamycin (CLEOCIN) 300 MG capsule Take 1 capsule (300 mg total) by mouth 4 (four) times daily. X 7 days 08/23/20   [name], MD  naproxen (NAPROSYN) 500 MG tablet Take 1 tablet (500 mg total) by mouth 2 (two) times daily. 09/21/21   [name], PA-C  ondansetron (ZOFRAN ODT) 4 MG disintegrating tablet Take 1 tablet (4 mg total) by mouth every 8 (eight) hours as needed for up to 10 doses for nausea or vomiting. 11/08/20   Sabino Donovan, MD      Allergies    Patient has no known allergies.    Review of Systems   Review of Systems  HENT:  Positive for congestion, rhinorrhea and sore throat.   All other systems reviewed and are negative.   Physical Exam Updated Vital Signs BP (!) 124/91   Pulse 86   Temp 98.4 F (36.9 C)   Resp 20   Ht 6' (1.829 m)   Wt 95.3 kg   SpO2 95%   BMI 28.48 kg/m  Physical  Exam Vitals and nursing note reviewed.  Constitutional:      General: He is not in acute distress.    Appearance: He is well-developed.     Comments: Resting comfortably in bed  HENT:     Head: Normocephalic and atraumatic.     Ears:     Comments: Bilateral cerumen impaction    Mouth/Throat:     Mouth: Mucous membranes are moist.     Pharynx: Oropharynx is clear. Uvula midline.     Comments: Uvula midline.  Tonsils 1+ bilaterally.  No obvious RPA or PTA.  No drooling, trismus or tripoding.  No tonsillar exudates. Eyes:     Conjunctiva/sclera: Conjunctivae normal.  Cardiovascular:     Rate and Rhythm: Normal rate and regular rhythm.     Heart sounds: No murmur heard. Pulmonary:     Effort: Pulmonary effort is normal. No respiratory distress.     Breath sounds: Normal breath sounds. No wheezing, rhonchi or rales.  Abdominal:     Palpations: Abdomen is soft.     Tenderness: There is no abdominal tenderness.  Musculoskeletal:        General: No swelling.     Cervical back: Neck supple.  Lymphadenopathy:     Cervical: Cervical adenopathy (  Anterior) present.  Skin:    General: Skin is warm and dry.     Capillary Refill: Capillary refill takes less than 2 seconds.  Neurological:     Mental Status: He is alert.  Psychiatric:        Mood and Affect: Mood normal.     ED Results / Procedures / Treatments   Labs (all labs ordered are listed, but only abnormal results are displayed) Labs Reviewed  RESP PANEL BY RT-PCR (RSV, FLU A&B, COVID)  [id number]  GROUP A STREP BY PCR    EKG None  Radiology No results found.  Procedures Procedures    Medications Ordered in ED Medications - No data to display  ED Course/ Medical Decision Making/ A&P                                 Medical Decision Making  This patient presents to the ED for concern of uri symptoms, this involves an extensive number of treatment options, and is a complaint that carries with it a high risk of  complications and morbidity.  The differential diagnosis includes flu, COVID, RSV, other viral URI  My initial workup includes respiratory panel, rapid strep  Additional history obtained from: Nursing notes from this visit.  I ordered, reviewed and interpreted labs which include: Nurse Gwyndolyn Kaufman panel, rapid strep.  Negative  Afebrile, hemodynamically stable.  62 year old male presenting to the ED for evaluation of constellation of symptoms consistent with a viral URI.  States he feels like when he was diagnosed with COVID in the past.  Symptoms are not gotten worse since they began but also have not gotten better.  Symptoms are described as fairly mild at this time.  He appears very well on physical exam.  He has some congestion and postnasal drip as well as anterior cervical lymphadenopathy, but no evidence of RPA or PTA.  No adventitious breath sounds or shortness of breath to suggest pneumonia.  Cough is likely secondary to postnasal drip.  Patient was given information regarding supportive care of his symptoms.  He was encouraged to establish care with a PCP and follow-up within the next week for reevaluation.  He was encouraged to quarantine until his symptoms improve for 24 continuous hours.  He was given return precautions.  Stable at discharge.  At this time there does not appear to be any evidence of an acute emergency medical condition and the patient appears stable for discharge with appropriate outpatient follow up. Diagnosis was discussed with patient who verbalizes understanding of care plan and is agreeable to discharge. I have discussed return precautions with patient who verbalizes understanding. Patient encouraged to follow-up with their PCP within 1 week. All questions answered.  Note: Portions of this report may have been transcribed using voice recognition software. Every effort was made to ensure accuracy; however, inadvertent computerized transcription errors may still be  present.        Final Clinical Impression(s) / ED Diagnoses Final diagnoses:  Viral URI with cough    Rx / DC Orders ED Discharge Orders     None         Michelle Piper, Cordelia Poche 05/13/23 2059    Maia Plan, MD 05/15/23 1223

## 2023-05-13 NOTE — ED Notes (Signed)
Pt c/o sore throat and fatigue since Friday

## 2023-05-13 NOTE — ED Triage Notes (Signed)
Pt c/o sore throat and general malaise since Friday

## 2023-06-18 ENCOUNTER — Encounter (HOSPITAL_BASED_OUTPATIENT_CLINIC_OR_DEPARTMENT_OTHER): Payer: Self-pay

## 2023-06-18 ENCOUNTER — Emergency Department (HOSPITAL_BASED_OUTPATIENT_CLINIC_OR_DEPARTMENT_OTHER)
Admission: EM | Admit: 2023-06-18 | Discharge: 2023-06-18 | Disposition: A | Discharge status: Home / Self Care | Payer: BC Managed Care – PPO | Attending: Emergency Medicine | Admitting: Emergency Medicine

## 2023-06-18 DIAGNOSIS — H6123 Impacted cerumen, bilateral: Secondary | ICD-10-CM | POA: Insufficient documentation

## 2023-06-18 MED ORDER — CIPROFLOXACIN-DEXAMETHASONE 0.3-0.1 % OT SUSP: 4.0000 [drp] | Freq: Two times a day (BID) | OTIC | 0 refills | Status: AC

## 2023-06-18 MED ORDER — DOCUSATE SODIUM 50 MG/5ML PO LIQD
200.0000 mg | Freq: Once | ORAL | Status: AC
  Administered 2023-06-18: 200 mg via ORAL
  Filled 2023-06-18: qty 20

## 2023-06-18 NOTE — Discharge Instructions (Signed)
Use drops as prescribed. Follow up with ENT for safe removal or remaining ear wax. Do not put q-tips or ear plugs in your ears.

## 2023-06-18 NOTE — ED Triage Notes (Signed)
Pt reports that he has had difficulty hearing out of his ears. States that it has been an issue for a few weeks. Denies pain

## 2023-06-18 NOTE — ED Notes (Addendum)
EDP  aware of results after first irrigation. Unable to get a lot of results from irrigation. More colace placed in ears. Will irrigate again.

## 2023-06-18 NOTE — ED Notes (Signed)
Colace applied to bilateral ears. Will allow to sit for 10 minutes prior to irrigation/removal

## 2023-06-18 NOTE — ED Provider Notes (Signed)
[name] Provider Note   CSN: [id number] Arrival date & time: 06/18/23  0901     History  Chief Complaint  Patient presents with   Ear Fullness    [name] is a 62 y.o. male.  62 year old male presents with complaint of feeling like both ears are clogged for the past few weeks. Worse after waking from a nap, not really able to clear/pop his ears. No cough, congestion, ear pain. Does not wear ear plugs or ear buds. No other complaints or concerns.        Home Medications Prior to Admission medications   Medication Sig Start Date End Date Taking? Authorizing Provider  ciprofloxacin-dexamethasone (CIPRODEX) OTIC suspension Place 4 drops into both ears 2 (two) times daily. 06/18/23  Yes Jeannie Fend, PA-C  amoxicillin (AMOXIL) 500 MG tablet Take 500 mg by mouth 3 (three) times daily. 07/18/20   [provider]  clindamycin (CLEOCIN) 300 MG capsule Take 1 capsule (300 mg total) by mouth 4 (four) times daily. X 7 days 08/23/20   [name], MD  naproxen (NAPROSYN) 500 MG tablet Take 1 tablet (500 mg total) by mouth 2 (two) times daily. 09/21/21   [name], PA-C  ondansetron (ZOFRAN ODT) 4 MG disintegrating tablet Take 1 tablet (4 mg total) by mouth every 8 (eight) hours as needed for up to 10 doses for nausea or vomiting. 11/08/20   Sabino Donovan, MD      Allergies    Patient has no known allergies.    Review of Systems   Review of Systems Negative except as per HPI Physical Exam Updated Vital Signs BP (!) 132/96 (BP Location: Right Arm)   Pulse 97   Temp 98.9 F (37.2 C) (Oral)   Resp 18   Ht 6' (1.829 m)   Wt 99.8 kg   SpO2 96%   BMI 29.84 kg/m  Physical Exam Vitals and nursing note reviewed.  Constitutional:      General: He is not in acute distress.    Appearance: He is well-developed. He is not diaphoretic.  HENT:     Head: Normocephalic and atraumatic.     Right Ear: There is impacted  cerumen.     Left Ear: There is impacted cerumen.     Nose: Nose normal.     Mouth/Throat:     Mouth: Mucous membranes are moist.     Pharynx: No oropharyngeal exudate or posterior oropharyngeal erythema.  Pulmonary:     Effort: Pulmonary effort is normal.  Neurological:     Mental Status: He is alert and oriented to person, place, and time.  Psychiatric:        Behavior: Behavior normal.     ED Results / Procedures / Treatments   Labs (all labs ordered are listed, but only abnormal results are displayed) Labs Reviewed - No data to display  EKG None  Radiology No results found.  Procedures .Ear Cerumen Removal  Date/Time: 06/18/2023 10:32 AM  Performed by: Jeannie Fend, PA-C Authorized by: Jeannie Fend, PA-C   Consent:    Consent obtained:  Verbal   Consent given by:  Patient   Risks, benefits, and alternatives were discussed: yes     Risks discussed:  Bleeding, infection, incomplete removal, pain and dizziness   Alternatives discussed:  No treatment Universal protocol:    Patient identity confirmed:  Verbally with patient Procedure details:    Location:  L  ear and R ear   Procedure type: irrigation     Procedure outcomes: cerumen removed   Post-procedure details:    Inspection:  Some cerumen remaining   Hearing quality:  Improved   Procedure completion:  Tolerated well, no immediate complications     Medications Ordered in ED Medications  docusate (COLACE) 50 MG/5ML liquid 200 mg (200 mg Oral Given 06/18/23 1610)    ED Course/ Medical Decision Making/ A&P                                 Medical Decision Making  This patient presents to the ED for concern of decreased hearing, this involves an extensive number of treatment options, and is a complaint that carries with it a high risk of complications and morbidity.  The differential diagnosis includes but not limited to eustachian tube dysfunction, cerumen impaction   Co morbidities that  complicate the patient evaluation  Kidney stones, recurrent cerumen impaction    Additional history obtained:  External records from outside source obtained and reviewed including prior records on file including visit to ER dated 04/01/22 for bilateral cerumen impaction   Problem List / ED Course / Critical interventions / Medication management  62 year old male with complaint of bilateral hearing decrease secondary to cerumen impaction. Nurses attempted irrigation with use of colace and elephant without success. I visualized continued impaction, attempted irrigation without success. Used curette with successful removal of significant cerumen from bilateral ears, tolerated well. Continued to remove as much cerumen as I could see to remove safely, has residual cerumen obscuring both Tms. Reports improvement in his hearing. Has never seen ENT. Referred to ENT for further treatment. Will cover with Ciprodex due to extensive clean out to prevent OM.  I ordered medication including colace  for cerumen impaction  Reevaluation of the patient after these medicines showed that the patient improved I have reviewed the patients home medicines and have made adjustments as needed   Social Determinants of Health:  No pcp on file   Test / Admission - Considered:  Stable for dc with referral to ENT         Final Clinical Impression(s) / ED Diagnoses Final diagnoses:  Hearing loss secondary to cerumen impaction, bilateral    Rx / DC Orders ED Discharge Orders          Ordered    ciprofloxacin-dexamethasone (CIPRODEX) OTIC suspension  2 times daily        06/18/23 1030              Jeannie Fend, PA-C 06/18/23 1039    Tegeler, Canary Brim, MD 06/18/23 1047
